# Patient Record
Sex: Male | Born: 1960 | Race: Black or African American | Hispanic: No | Smoking: Current every day smoker
Health system: Southern US, Community
[De-identification: ages and names within clinical notes are randomized; demographics above are authoritative.]

## PROBLEM LIST (undated history)

## (undated) DIAGNOSIS — B1920 Unspecified viral hepatitis C without hepatic coma: Secondary | ICD-10-CM

## (undated) DIAGNOSIS — M79604 Pain in right leg: Secondary | ICD-10-CM

## (undated) DIAGNOSIS — I10 Essential (primary) hypertension: Secondary | ICD-10-CM

## (undated) DIAGNOSIS — J40 Bronchitis, not specified as acute or chronic: Secondary | ICD-10-CM

## (undated) DIAGNOSIS — F250 Schizoaffective disorder, bipolar type: Secondary | ICD-10-CM

## (undated) DIAGNOSIS — F319 Bipolar disorder, unspecified: Secondary | ICD-10-CM

## (undated) DIAGNOSIS — F259 Schizoaffective disorder, unspecified: Secondary | ICD-10-CM

## (undated) DIAGNOSIS — M79605 Pain in left leg: Secondary | ICD-10-CM

## (undated) DIAGNOSIS — K852 Alcohol induced acute pancreatitis without necrosis or infection: Secondary | ICD-10-CM

## (undated) HISTORY — PX: ABDOMINAL SURGERY: SHX537

## (undated) HISTORY — PX: OTHER SURGICAL HISTORY: SHX169

---

## 2014-07-25 ENCOUNTER — Emergency Department (HOSPITAL_COMMUNITY)
Admission: EM | Admit: 2014-07-25 | Discharge: 2014-07-26 | Disposition: A | Discharge status: Home / Self Care | Payer: Medicaid Other | Attending: Emergency Medicine | Admitting: Emergency Medicine

## 2014-07-25 ENCOUNTER — Encounter (HOSPITAL_COMMUNITY): Payer: Self-pay | Admitting: Emergency Medicine

## 2014-07-25 DIAGNOSIS — M79609 Pain in unspecified limb: Secondary | ICD-10-CM | POA: Insufficient documentation

## 2014-07-25 DIAGNOSIS — H9209 Otalgia, unspecified ear: Secondary | ICD-10-CM | POA: Insufficient documentation

## 2014-07-25 DIAGNOSIS — F17200 Nicotine dependence, unspecified, uncomplicated: Secondary | ICD-10-CM | POA: Diagnosis not present

## 2014-07-25 DIAGNOSIS — M79604 Pain in right leg: Secondary | ICD-10-CM

## 2014-07-25 DIAGNOSIS — Z8709 Personal history of other diseases of the respiratory system: Secondary | ICD-10-CM | POA: Insufficient documentation

## 2014-07-25 DIAGNOSIS — M79605 Pain in left leg: Secondary | ICD-10-CM

## 2014-07-25 DIAGNOSIS — H9203 Otalgia, bilateral: Secondary | ICD-10-CM

## 2014-07-25 DIAGNOSIS — Z8719 Personal history of other diseases of the digestive system: Secondary | ICD-10-CM | POA: Insufficient documentation

## 2014-07-25 DIAGNOSIS — I10 Essential (primary) hypertension: Secondary | ICD-10-CM | POA: Diagnosis not present

## 2014-07-25 HISTORY — DX: Bronchitis, not specified as acute or chronic: J40

## 2014-07-25 HISTORY — DX: Pain in left leg: M79.605

## 2014-07-25 HISTORY — DX: Alcohol induced acute pancreatitis without necrosis or infection: K85.20

## 2014-07-25 HISTORY — DX: Pain in right leg: M79.604

## 2014-07-25 HISTORY — DX: Essential (primary) hypertension: I10

## 2014-07-25 NOTE — ED Notes (Addendum)
Presents with chronic pain in bilateral legs, bilateral ear pain, billateral ear redness.

## 2014-07-26 MED ORDER — CETIRIZINE HCL 10 MG PO CAPS: 10.0000 mg | ORAL_CAPSULE | Freq: Every evening | ORAL | Status: DC | PRN

## 2014-07-26 MED ORDER — FLUTICASONE PROPIONATE 50 MCG/ACT NA SUSP: 1.0000 | Freq: Every day | NASAL | Status: DC

## 2014-07-26 MED ORDER — TRAMADOL HCL 50 MG PO TABS
50.0000 mg | ORAL_TABLET | Freq: Once | ORAL | Status: AC
  Administered 2014-07-26: 50 mg via ORAL
  Filled 2014-07-26: qty 1

## 2014-07-26 MED ORDER — TRAMADOL HCL 50 MG PO TABS: 50.0000 mg | ORAL_TABLET | Freq: Four times a day (QID) | ORAL | Status: DC | PRN
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
## 2014-07-26 NOTE — ED Provider Notes (Signed)
Medical screening examination/treatment/procedure(s) were performed by non-physician practitioner and as supervising physician I was immediately available for consultation/collaboration.   Vaeda Westall, MD 07/26/14 0554 

## 2014-07-26 NOTE — Discharge Instructions (Signed)
Otalgia  The most common reason for this in children is an infection of the middle ear. Pain from the middle ear is usually caused by a build-up of fluid and pressure behind the eardrum. Pain from an earache can be sharp, dull, or burning. The pain may be temporary or constant. The middle ear is connected to the nasal passages by a short narrow tube called the Eustachian tube. The Eustachian tube allows fluid to drain out of the middle ear, and helps keep the pressure in your ear equalized.  CAUSES   A cold or allergy can block the Eustachian tube with inflammation and the build-up of secretions. This is especially likely in small children, because their Eustachian tube is shorter and more horizontal. When the Eustachian tube closes, the normal flow of fluid from the middle ear is stopped. Fluid can accumulate and cause stuffiness, pain, hearing loss, and an ear infection if germs start growing in this area.  SYMPTOMS   The symptoms of an ear infection may include fever, ear pain, fussiness, increased crying, and irritability. Many children will have temporary and minor hearing loss during and right after an ear infection. Permanent hearing loss is rare, but the risk increases the more infections a child has. Other causes of ear pain include retained water in the outer ear canal from swimming and bathing.  Ear pain in adults is less likely to be from an ear infection. Ear pain may be referred from other locations. Referred pain may be from the joint between your jaw and the skull. It may also come from a tooth problem or problems in the neck. Other causes of ear pain include:   A foreign body in the ear.   Outer ear infection.   Sinus infections.   Impacted ear wax.   Ear injury.   Arthritis of the jaw or TMJ problems.   Middle ear infection.   Tooth infections.   Sore throat with pain to the ears.  DIAGNOSIS   Your caregiver can usually make the diagnosis by examining you. Sometimes other special studies,  including x-rays and lab work may be necessary.  TREATMENT    If antibiotics were prescribed, use them as directed and finish them even if you or your child's symptoms seem to be improved.   Sometimes PE tubes are needed in children. These are little plastic tubes which are put into the eardrum during a simple surgical procedure. They allow fluid to drain easier and allow the pressure in the middle ear to equalize. This helps relieve the ear pain caused by pressure changes.  HOME CARE INSTRUCTIONS    Only take over-the-counter or prescription medicines for pain, discomfort, or fever as directed by your caregiver. DO NOT GIVE CHILDREN ASPIRIN because of the association of Reye's Syndrome in children taking aspirin.   Use a cold pack applied to the outer ear for 15-20 minutes, 03-04 times per day or as needed may reduce pain. Do not apply ice directly to the skin. You may cause frost bite.   Over-the-counter ear drops used as directed may be effective. Your caregiver may sometimes prescribe ear drops.   Resting in an upright position may help reduce pressure in the middle ear and relieve pain.   Ear pain caused by rapidly descending from high altitudes can be relieved by swallowing or chewing gum. Allowing infants to suck on a bottle during airplane travel can help.   Do not smoke in the house or near children. If you are   unable to quit smoking, smoke outside.   Control allergies.  SEEK IMMEDIATE MEDICAL CARE IF:    You or your child are becoming sicker.   Pain or fever relief is not obtained with medicine.   You or your child's symptoms (pain, fever, or irritability) do not improve within 24 to 48 hours or as instructed.   Severe pain suddenly stops hurting. This may indicate a ruptured eardrum.   You or your children develop new problems such as severe headaches, stiff neck, difficulty swallowing, or swelling of the face or around the ear.  Document Released: 07/19/2004 Document Revised: 02/24/2012  Document Reviewed: 11/23/2008  [employer] Patient Information 2015 [employer]. This information is not intended to replace advice given to you by your health care provider. Make sure you discuss any questions you have with your health care provider.

## 2014-07-31 ENCOUNTER — Encounter (HOSPITAL_BASED_OUTPATIENT_CLINIC_OR_DEPARTMENT_OTHER): Payer: Self-pay | Admitting: Emergency Medicine

## 2014-07-31 ENCOUNTER — Emergency Department (HOSPITAL_BASED_OUTPATIENT_CLINIC_OR_DEPARTMENT_OTHER)
Admission: EM | Admit: 2014-07-31 | Discharge: 2014-07-31 | Disposition: A | Discharge status: Home / Self Care | Payer: Medicaid Other | Attending: Emergency Medicine | Admitting: Emergency Medicine

## 2014-07-31 DIAGNOSIS — IMO0002 Reserved for concepts with insufficient information to code with codable children: Secondary | ICD-10-CM | POA: Diagnosis not present

## 2014-07-31 DIAGNOSIS — Z8709 Personal history of other diseases of the respiratory system: Secondary | ICD-10-CM | POA: Insufficient documentation

## 2014-07-31 DIAGNOSIS — F17200 Nicotine dependence, unspecified, uncomplicated: Secondary | ICD-10-CM | POA: Diagnosis not present

## 2014-07-31 DIAGNOSIS — R109 Unspecified abdominal pain: Secondary | ICD-10-CM | POA: Insufficient documentation

## 2014-07-31 DIAGNOSIS — K861 Other chronic pancreatitis: Secondary | ICD-10-CM | POA: Diagnosis not present

## 2014-07-31 DIAGNOSIS — Z79899 Other long term (current) drug therapy: Secondary | ICD-10-CM | POA: Diagnosis not present

## 2014-07-31 DIAGNOSIS — I10 Essential (primary) hypertension: Secondary | ICD-10-CM | POA: Insufficient documentation

## 2014-07-31 DIAGNOSIS — F1010 Alcohol abuse, uncomplicated: Secondary | ICD-10-CM | POA: Insufficient documentation

## 2014-07-31 DIAGNOSIS — H9209 Otalgia, unspecified ear: Secondary | ICD-10-CM | POA: Diagnosis not present

## 2014-07-31 DIAGNOSIS — H9190 Unspecified hearing loss, unspecified ear: Secondary | ICD-10-CM | POA: Insufficient documentation

## 2014-07-31 DIAGNOSIS — K860 Alcohol-induced chronic pancreatitis: Secondary | ICD-10-CM

## 2014-07-31 LAB — CBC WITH DIFFERENTIAL/PLATELET
Basophils Absolute: 0 10*3/uL (ref 0.0–0.1)
Basophils Relative: 0 % (ref 0–1)
Eosinophils Absolute: 0.2 10*3/uL (ref 0.0–0.7)
Eosinophils Relative: 3 % (ref 0–5)
HEMATOCRIT: 36.8 % — AB (ref 39.0–52.0)
HEMOGLOBIN: 13.2 g/dL (ref 13.0–17.0)
LYMPHS PCT: 51 % — AB (ref 12–46)
Lymphs Abs: 3.8 10*3/uL (ref 0.7–4.0)
MCH: 35.4 pg — ABNORMAL HIGH (ref 26.0–34.0)
MCHC: 35.9 g/dL (ref 30.0–36.0)
MCV: 98.7 fL (ref 78.0–100.0)
MONO ABS: 0.9 10*3/uL (ref 0.1–1.0)
Monocytes Relative: 12 % (ref 3–12)
NEUTROS ABS: 2.5 10*3/uL (ref 1.7–7.7)
Neutrophils Relative %: 33 % — ABNORMAL LOW (ref 43–77)
Platelets: 183 10*3/uL (ref 150–400)
RBC: 3.73 MIL/uL — ABNORMAL LOW (ref 4.22–5.81)
RDW: 15.8 % — AB (ref 11.5–15.5)
WBC: 7.4 10*3/uL (ref 4.0–10.5)

## 2014-07-31 LAB — COMPREHENSIVE METABOLIC PANEL
ALBUMIN: 2.8 g/dL — AB (ref 3.5–5.2)
ALT: 70 U/L — ABNORMAL HIGH (ref 0–53)
AST: 127 U/L — ABNORMAL HIGH (ref 0–37)
Alkaline Phosphatase: 172 U/L — ABNORMAL HIGH (ref 39–117)
Anion gap: 12 (ref 5–15)
BILIRUBIN TOTAL: 1 mg/dL (ref 0.3–1.2)
BUN: 13 mg/dL (ref 6–23)
CO2: 22 mEq/L (ref 19–32)
CREATININE: 1 mg/dL (ref 0.50–1.35)
Calcium: 9 mg/dL (ref 8.4–10.5)
Chloride: 104 mEq/L (ref 96–112)
GFR calc Af Amer: >90 mL/min (ref >90)
GFR calc non Af Amer: 84 mL/min — ABNORMAL LOW (ref >90)
Glucose, Bld: 87 mg/dL (ref 70–99)
Potassium: 4.1 mEq/L (ref 3.7–5.3)
Sodium: 138 mEq/L (ref 137–147)
TOTAL PROTEIN: 7.5 g/dL (ref 6.0–8.3)

## 2014-07-31 LAB — LIPASE, BLOOD: LIPASE: 64 U/L — AB (ref 11–59)

## 2014-07-31 MED ORDER — OXYCODONE HCL 5 MG PO TABS
5.0000 mg | ORAL_TABLET | Freq: Once | ORAL | Status: AC
  Administered 2014-07-31: 5 mg via ORAL
  Filled 2014-07-31: qty 1

## 2014-07-31 MED ORDER — OXYCODONE HCL 5 MG PO TABS: 5.0000 mg | ORAL_TABLET | Freq: Four times a day (QID) | ORAL | Status: DC | PRN
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
## 2014-07-31 NOTE — Discharge Instructions (Signed)
Low-Fat Diet for Pancreatitis or Gallbladder Conditions Avoid alcohol. Stay on clear liquids for the next 24 hours. To help with  Your drinking problem. Call in the of the numbers on the resource guide to get help. Contact Dr. Jearld Fenton scheduled office appointment regarding your ear pain and hearing loss A low-fat diet can be helpful if you have pancreatitis or a gallbladder condition. With these conditions, your pancreas and gallbladder have trouble digesting fats. A healthy eating plan with less fat will help rest your pancreas and gallbladder and reduce your symptoms. WHAT DO I NEED TO KNOW ABOUT THIS DIET?  Eat a low-fat diet.  Reduce your fat intake to less than 20-30% of your total daily calories. This is less than 50-60 g of fat per day.  Remember that you need some fat in your diet. Ask your dietician what your daily goal should be.  Choose nonfat and low-fat healthy foods. Look for the words "nonfat," "low fat," or "fat free."  As a guide, look on the label and choose foods with less than 3 g of fat per serving. Eat only one serving.  Avoid alcohol.  Do not smoke. If you need help quitting, talk with your health care provider.  Eat small frequent meals instead of three large heavy meals. WHAT FOODS CAN I EAT? Grains Include healthy grains and starches such as potatoes, wheat bread, fiber-rich cereal, and brown rice. Choose whole grain options whenever possible. In adults, whole grains should account for 45-65% of your daily calories.  Fruits and Vegetables Eat plenty of fruits and vegetables. Fresh fruits and vegetables add fiber to your diet. Meats and Other Protein Sources Eat lean meat such as chicken and pork. Trim any fat off of meat before cooking it. Eggs, fish, and beans are other sources of protein. In adults, these foods should account for 10-35% of your daily calories. Dairy Choose low-fat milk and dairy options. Dairy includes fat and protein, as well as calcium.  Fats  and Oils Limit high-fat foods such as fried foods, sweets, baked goods, sugary drinks.  Other Creamy sauces and condiments, such as mayonnaise, can add extra fat. Think about whether or not you need to use them, or use smaller amounts or low fat options. WHAT FOODS ARE NOT RECOMMENDED?  High fat foods, such as:  [employer].  Ice cream.  [place] toast.  Sweet rolls.  Pizza.  Cheese bread.  Foods covered with batter, butter, creamy sauces, or cheese.  Fried foods.  Sugary drinks and desserts.  Foods that cause gas or bloating Document Released: 12/07/2013 Document Reviewed: 12/07/2013 [facility] Patient Information 2015 [place], [place]. This information is not intended to replace advice given to you by your health care provider. Make sure you discuss any questions you have with your health care provider.  Emergency Department Resource Guide 1) Find a Doctor and Pay Out of Pocket Although you won't have to find out who is covered by your insurance plan, it is a good idea to ask around and get recommendations. You will then need to call the office and see if the doctor you have chosen will accept you as a new patient and what types of options they offer for patients who are self-pay. Some doctors offer discounts or will set up payment plans for their patients who do not have insurance, but you will need to ask so you aren't surprised when you get to your appointment.  2) Contact Your Local Health Department Not all health departments have  doctors that can see patients for sick visits, but many do, so it is worth a call to see if yours does. If you don't know where your local health department is, you can check in your phone book. The CDC also has a tool to help you locate your state's health department, and many state websites also have listings of all of their local health departments.  3) Find a Walk-in Clinic If your illness is not likely to be very severe or complicated, you may  want to try a walk in clinic. These are popping up all over the country in pharmacies, drugstores, and shopping centers. They're usually staffed by nurse practitioners or physician assistants that have been trained to treat common illnesses and complaints. They're usually fairly quick and inexpensive. However, if you have serious medical issues or chronic medical problems, these are probably not your best option.  No Primary Care Doctor: - Call Health Connect at  [phone] - they can help you locate a primary care doctor that  accepts your insurance, provides certain services, etc. - Physician Referral Service- [phone]  Chronic Pain Problems: Organization         Address  Phone   Notes  [facility]  [phone] Patients need to be referred by their primary care doctor.   Medication Assistance: Organization         Address  Phone   Notes  [facility] Medication [facility] [address] [place], [place] [address] [phone] --Must be a resident of [facility] -- Must have NO insurance coverage whatsoever (no Medicaid/ Medicare, etc.) -- The pt. MUST have a primary care doctor that directs their care regularly and follows them in the community   [facility]ist  [phone]   [employer]  [phone]    Agencies that provide inexpensive medical care: Organization         Address  Phone   Notes  [facility]  [phone]   [facility]    [phone]   [facility] [address] [place], [place] [address] [phone]   [facility] [address] [place] [place] [phone]   Planned Parenthood    [phone]   [facility]    [phone]   [facility]  [address], [place] Phone:  [phone], Fax:  [phone] Hours of Operation:  9 am - 6 pm, M-F.  Also accepts Medicaid/Medicare and self-pay.   [facility]  [address], [place] Phone: [phone], Fax: [phone]. Hours of Operation:  8:30 am - 5:30 pm, M-F.  Also accepts Medicaid and self-pay.  [facility] [place] [address] [place] Phone: [phone]   [facility] [address] Natasha Bence [place], [place] [phone], Ext. [phone] Mondays & Thursdays: 7-9 AM.  First 15 patients are seen on a first come, first serve basis.    Medicaid-accepting [facility] Providers:  Organization         Address  Phone   Notes  [facility] [address], [place] [phone] Also accepts self-pay patients.  [facility] [address] Laurell Josephs [place], [place]  [phone]   [facility] [address], [place] [phone]   [facility] [address] [place], [place] [phone]   Renaye Rakers [address], [place]   [phone] Only accepts [place] patients after they have their name applied to their card.   Self-Pay (no insurance) in [facility]:  Organization         Address  Phone   Notes  Sickle Cell Patients, [facility] [address] [place], [place] [phone]   [facility] [address] [place], [place] [phone]   [facility] [place]  [address], [place] [phone]   [facility]/Dr. Osei-Bonsu  [address] [place] or [address], [place] [phone] Phone number for both [place] and [place] locations is the same.  [facility] [address] [place] [phone]   [facility] [address] [place] or [address] [phone]   [facility] [address], [place] [phone], phone; [phone], fax Sees patients 1st and 3rd Saturday of every month.  Must not qualify for public or private insurance (i.e. Medicaid, Medicare, [place] Health Choice, Veterans' Benefits)  Household income should be no more than 200% of the poverty level The clinic cannot treat you if you are pregnant or think you are pregnant  Sexually transmitted diseases are not treated at the clinic.    Dental Care: Organization         Address  Phone  Notes  [facility] Department of [facility] [address] [place], [place] [phone] Accepts children up to age 20 who are enrolled in [place] or [place] Health Choice; pregnant women with a Medicaid card; and children who have applied for Medicaid or [place] Health Choice, but were declined, whose parents can pay a reduced fee at time of service.  [facility] Department of [facility]  [address], Rosa Sanchez [phone] Accepts children up to age 57 who are enrolled in [place] or [place] Health Choice; pregnant women with a Medicaid card; and children who have applied for Medicaid or [place] Health Choice, but were declined, whose parents can pay a reduced fee at time of service.  [facility]  [address] [place], [place] [phone] Patients are seen by appointment only. Walk-ins are not accepted. [facility] will see patients 46 years of age and older. Monday - Tuesday (8am-5pm) Most Wednesdays (8:30-5pm) $30 per visit, cash only  [facility]  [address], [facility] [place] [facility] [phone] Patients are seen by appointment only. Walk-ins are not accepted. [facility] will see patients 35 years of age and older. One Wednesday Evening (Monthly: Volunteer Based).  $30 per visit, cash only  [employer]  [phone] for adults; Children under age 33, call Graduate Pediatric Dentistry at [phone]. Children aged 49-14, please call [phone] to request a pediatric application.  Dental services are provided in all areas of dental care including fillings, crowns and bridges, complete and partial dentures, implants, gum treatment, root canals, and extractions. Preventive care is also provided. Treatment is provided to both adults and children. Patients are selected via a lottery and there is often a waiting list.   [facility] [address], [place]  [phone] [email or web address]   [facility] [phone]  [address], [place], [place] [phone], Ext. [phone] Second and Fourth Thursday of each month, opens at 6:30 AM; Clinic ends at 9 AM.  Patients are seen on a first-come first-served basis, and a limited number are seen during each clinic.   [facility]  [address], [place], [place] [phone]   Eligibility Requirements You must have lived in [place], [place], or [place] counties for at least the last three months.   You cannot be eligible for state or federal sponsored [place] Cityhealthcare insurance, including CIGNAVeterans Administration, [place]icaid, or [employer]icare.   You generally cannot be eligible for healthcare insurance through your employer.    How to apply: Eligibility screenings are held every Tuesday and Wednesday afternoon from 1:00 pm until 4:00 pm. You do not need an appointment for the interview!  [facility] [address], [place], [place] [phone]   [facility]  [phone]   [facility]  [phone]   [facility]  [phone]    Behavioral Health Resources in the Community: Intensive Outpatient Programs Organization         Address  Phone  Notes  [facility] [address], [place], [place] [phone]   [facility] [address], [place], [place] [phone]   [facility] [address], [place], [place]  [phone]    [facility] [address],  [place], [place] [phone] or [phone]   Substance Abuse Resources Organization         Address  Phone  Notes  [facility]  [phone]   [facility]  [phone]   [facility]  [phone]   [facility]  [phone]   Residential & Outpatient Substance Abuse Program  [phone]   Psychological Services Organization         Address  Phone  Notes  [facility]  [phone]- [phone]   [facility]  [phone]- [phone]   [facility] [address], [place] [phone] or [phone]    Mobile Crisis Teams Organization         Address  Phone  Notes  [facility], Mobile Crisis Care Unit  [phone]   [facility]  [address] [place], [place] [phone]   Doristine LocksSharon DeEsch [address] [place] [phone]    Self-Help/Support Groups Organization         Address  Phone             Notes  [facility] [place] - variety of support groups  336- [phone] Call for more information  Narcotics Anonymous (NA), [facility] [address], [place]  2 meetings at this location   [facility]ian[facility]idential Treatment Programs Organization         Address  Phone  Notes  [facility] [address],    [place]  [phone]   [facility]  [address], [place] [phone], [place], [place] [phone]   [facility] [address], [place] [phone] Admissions: 8am-3pm M-F  [facility] [address]    [place], [place] [phone]   [facility] [address], [place], [place] [phone]   [facility] [address]  [place], [place] [phone]   [facility] - Intensive Outpatient [phone], [place], [place]  [phone]   [facility] ([facility] [address] [place].,  [place], [place] [phone] or [phone]   [facility] ([facility]) [address], [place], [place] [phone] Accepts Medicaid  [facility] [address]  [place] [phone] Substance Abuse/Addiction Treatment   [facility] Organization         Address  Phone  Notes  [facility]  [phone]   Angie Fava, PhD [address] [place], [place]   [phone] or [phone]   [facility]   [address] [place], [place] [phone]   [facility] [address] [place], [place] [phone] Insurance/Medicaid/sponsorship through [facility] and Families [address]                                    [place], [place] [phone] Therapy/tele-psych/case  [facility] [address], [place] [phone]    Dr. Lolly Mustache  [phone]   [facility] [place]  [employer] [facility] Dept. 1) [address] [place] 2) [address] [place] 3)  [address], [place] [phone]  [phone]   [facility] Child Abuse Hotline ([phone] or 5158101826 (After Hours)

## 2014-07-31 NOTE — ED Notes (Signed)
Patient states that his abd hurts, has had pancreatitis in th epast and feels the same. Also c/o hearing loss in both ears.

## 2016-11-15 DIAGNOSIS — R945 Abnormal results of liver function studies: Secondary | ICD-10-CM | POA: Insufficient documentation

## 2016-11-15 DIAGNOSIS — F419 Anxiety disorder, unspecified: Secondary | ICD-10-CM | POA: Insufficient documentation

## 2016-11-15 DIAGNOSIS — K529 Noninfective gastroenteritis and colitis, unspecified: Secondary | ICD-10-CM | POA: Insufficient documentation

## 2016-11-15 DIAGNOSIS — R7989 Other specified abnormal findings of blood chemistry: Secondary | ICD-10-CM

## 2016-11-15 DIAGNOSIS — I10 Essential (primary) hypertension: Secondary | ICD-10-CM | POA: Insufficient documentation

## 2016-11-15 DIAGNOSIS — R778 Other specified abnormalities of plasma proteins: Secondary | ICD-10-CM | POA: Insufficient documentation

## 2016-11-15 DIAGNOSIS — F329 Major depressive disorder, single episode, unspecified: Secondary | ICD-10-CM | POA: Insufficient documentation

## 2016-12-02 ENCOUNTER — Encounter (HOSPITAL_COMMUNITY): Payer: Self-pay | Admitting: Emergency Medicine

## 2016-12-02 ENCOUNTER — Inpatient Hospital Stay (HOSPITAL_COMMUNITY): Payer: Medicaid Other

## 2016-12-02 ENCOUNTER — Inpatient Hospital Stay (HOSPITAL_COMMUNITY)
Admission: EM | Admit: 2016-12-02 | Discharge: 2016-12-10 | DRG: 441 | Disposition: A | Discharge status: Skilled Nursing Facility | Payer: Medicaid Other | Attending: Internal Medicine | Admitting: Internal Medicine

## 2016-12-02 ENCOUNTER — Emergency Department (HOSPITAL_COMMUNITY): Payer: Medicaid Other

## 2016-12-02 DIAGNOSIS — I890 Lymphedema, not elsewhere classified: Secondary | ICD-10-CM | POA: Diagnosis present

## 2016-12-02 DIAGNOSIS — I110 Hypertensive heart disease with heart failure: Secondary | ICD-10-CM | POA: Diagnosis present

## 2016-12-02 DIAGNOSIS — R600 Localized edema: Secondary | ICD-10-CM | POA: Diagnosis not present

## 2016-12-02 DIAGNOSIS — R601 Generalized edema: Secondary | ICD-10-CM | POA: Diagnosis not present

## 2016-12-02 DIAGNOSIS — F1010 Alcohol abuse, uncomplicated: Secondary | ICD-10-CM | POA: Diagnosis present

## 2016-12-02 DIAGNOSIS — K7682 Hepatic encephalopathy: Secondary | ICD-10-CM | POA: Diagnosis present

## 2016-12-02 DIAGNOSIS — I4581 Long QT syndrome: Secondary | ICD-10-CM | POA: Diagnosis present

## 2016-12-02 DIAGNOSIS — R262 Difficulty in walking, not elsewhere classified: Secondary | ICD-10-CM

## 2016-12-02 DIAGNOSIS — E876 Hypokalemia: Secondary | ICD-10-CM | POA: Diagnosis present

## 2016-12-02 DIAGNOSIS — R609 Edema, unspecified: Secondary | ICD-10-CM

## 2016-12-02 DIAGNOSIS — K7200 Acute and subacute hepatic failure without coma: Secondary | ICD-10-CM | POA: Diagnosis present

## 2016-12-02 DIAGNOSIS — N5082 Scrotal pain: Secondary | ICD-10-CM | POA: Diagnosis present

## 2016-12-02 DIAGNOSIS — I5033 Acute on chronic diastolic (congestive) heart failure: Secondary | ICD-10-CM | POA: Diagnosis present

## 2016-12-02 DIAGNOSIS — Z79899 Other long term (current) drug therapy: Secondary | ICD-10-CM

## 2016-12-02 DIAGNOSIS — K7290 Hepatic failure, unspecified without coma: Secondary | ICD-10-CM | POA: Diagnosis not present

## 2016-12-02 DIAGNOSIS — D689 Coagulation defect, unspecified: Secondary | ICD-10-CM | POA: Diagnosis present

## 2016-12-02 DIAGNOSIS — E8809 Other disorders of plasma-protein metabolism, not elsewhere classified: Secondary | ICD-10-CM | POA: Diagnosis present

## 2016-12-02 DIAGNOSIS — N5089 Other specified disorders of the male genital organs: Secondary | ICD-10-CM | POA: Diagnosis present

## 2016-12-02 DIAGNOSIS — D649 Anemia, unspecified: Secondary | ICD-10-CM | POA: Diagnosis present

## 2016-12-02 DIAGNOSIS — F17210 Nicotine dependence, cigarettes, uncomplicated: Secondary | ICD-10-CM | POA: Diagnosis present

## 2016-12-02 DIAGNOSIS — F259 Schizoaffective disorder, unspecified: Secondary | ICD-10-CM | POA: Diagnosis present

## 2016-12-02 DIAGNOSIS — B1920 Unspecified viral hepatitis C without hepatic coma: Secondary | ICD-10-CM | POA: Diagnosis present

## 2016-12-02 DIAGNOSIS — R52 Pain, unspecified: Secondary | ICD-10-CM

## 2016-12-02 DIAGNOSIS — N50819 Testicular pain, unspecified: Secondary | ICD-10-CM

## 2016-12-02 HISTORY — DX: Unspecified viral hepatitis C without hepatic coma: B19.20

## 2016-12-02 LAB — CBC WITH DIFFERENTIAL/PLATELET
BASOS ABS: 0 10*3/uL (ref 0.0–0.1)
Basophils Relative: 0 %
Eosinophils Absolute: 0.2 10*3/uL (ref 0.0–0.7)
Eosinophils Relative: 1 %
HCT: 29.9 % — ABNORMAL LOW (ref 39.0–52.0)
Hemoglobin: 10.6 g/dL — ABNORMAL LOW (ref 13.0–17.0)
LYMPHS PCT: 23 %
Lymphs Abs: 3.5 10*3/uL (ref 0.7–4.0)
MCH: 34.3 pg — AB (ref 26.0–34.0)
MCHC: 35.5 g/dL (ref 30.0–36.0)
MCV: 96.8 fL (ref 78.0–100.0)
MONOS PCT: 13 %
Monocytes Absolute: 2 10*3/uL — ABNORMAL HIGH (ref 0.1–1.0)
NEUTROS PCT: 63 %
Neutro Abs: 9.6 10*3/uL — ABNORMAL HIGH (ref 1.7–7.7)
PLATELETS: 173 10*3/uL (ref 150–400)
RBC: 3.09 MIL/uL — ABNORMAL LOW (ref 4.22–5.81)
RDW: 17.3 % — ABNORMAL HIGH (ref 11.5–15.5)
WBC: 15.3 10*3/uL — AB (ref 4.0–10.5)

## 2016-12-02 LAB — COMPREHENSIVE METABOLIC PANEL
ALT: 37 U/L (ref 17–63)
AST: 90 U/L — ABNORMAL HIGH (ref 15–41)
Albumin: 2.1 g/dL — ABNORMAL LOW (ref 3.5–5.0)
Alkaline Phosphatase: 154 U/L — ABNORMAL HIGH (ref 38–126)
Anion gap: 7 (ref 5–15)
BUN: 14 mg/dL (ref 6–20)
CHLORIDE: 108 mmol/L (ref 101–111)
CO2: 24 mmol/L (ref 22–32)
Calcium: 8.7 mg/dL — ABNORMAL LOW (ref 8.9–10.3)
Creatinine, Ser: 1.18 mg/dL (ref 0.61–1.24)
Glucose, Bld: 96 mg/dL (ref 65–99)
Potassium: 3.6 mmol/L (ref 3.5–5.1)
SODIUM: 139 mmol/L (ref 135–145)
Total Bilirubin: 6.5 mg/dL — ABNORMAL HIGH (ref 0.3–1.2)
Total Protein: 7.8 g/dL (ref 6.5–8.1)

## 2016-12-02 LAB — AMMONIA: Ammonia: 83 umol/L — ABNORMAL HIGH (ref 9–35)

## 2016-12-02 LAB — LIPASE, BLOOD: Lipase: 25 U/L (ref 11–51)

## 2016-12-02 LAB — TSH: TSH: 1.882 u[IU]/mL (ref 0.350–4.500)

## 2016-12-02 LAB — PROTIME-INR
INR: 2.04
PROTHROMBIN TIME: 23.3 s — AB (ref 11.4–15.2)

## 2016-12-02 LAB — BRAIN NATRIURETIC PEPTIDE: B NATRIURETIC PEPTIDE 5: 291.2 pg/mL — AB (ref 0.0–100.0)

## 2016-12-02 LAB — I-STAT TROPONIN, ED: TROPONIN I, POC: 0 ng/mL (ref 0.00–0.08)

## 2016-12-02 LAB — ETHANOL

## 2016-12-02 LAB — MAGNESIUM: MAGNESIUM: 1.7 mg/dL (ref 1.7–2.4)

## 2016-12-02 MED ORDER — SODIUM CHLORIDE 0.9% FLUSH
3.0000 mL | Freq: Two times a day (BID) | INTRAVENOUS | Status: DC
  Administered 2016-12-02 – 2016-12-04 (×4): 3 mL via INTRAVENOUS

## 2016-12-02 MED ORDER — FUROSEMIDE 10 MG/ML IJ SOLN: 60.0000 mg | Freq: Once | INTRAMUSCULAR | Status: DC

## 2016-12-02 MED ORDER — FOLIC ACID 1 MG PO TABS
1.0000 mg | ORAL_TABLET | Freq: Every day | ORAL | Status: DC
  Administered 2016-12-03 – 2016-12-10 (×8): 1 mg via ORAL
  Filled 2016-12-02 (×8): qty 1

## 2016-12-02 MED ORDER — ADULT MULTIVITAMIN W/MINERALS CH
1.0000 | ORAL_TABLET | Freq: Every day | ORAL | Status: DC
  Administered 2016-12-03 – 2016-12-10 (×8): 1 via ORAL
  Filled 2016-12-02 (×8): qty 1

## 2016-12-02 MED ORDER — LORAZEPAM 1 MG PO TABS: 0.0000 mg | ORAL_TABLET | Freq: Two times a day (BID) | ORAL | Status: DC

## 2016-12-02 MED ORDER — LACTULOSE 10 GM/15ML PO SOLN
20.0000 g | Freq: Three times a day (TID) | ORAL | Status: DC
  Administered 2016-12-02 – 2016-12-03 (×2): 20 g via ORAL
  Filled 2016-12-02 (×2): qty 30

## 2016-12-02 MED ORDER — SODIUM CHLORIDE 0.9% FLUSH: 3.0000 mL | INTRAVENOUS | Status: DC | PRN

## 2016-12-02 MED ORDER — SERTRALINE HCL 50 MG PO TABS
100.0000 mg | ORAL_TABLET | Freq: Every day | ORAL | Status: DC
  Administered 2016-12-03 – 2016-12-10 (×8): 100 mg via ORAL
  Filled 2016-12-02 (×8): qty 2

## 2016-12-02 MED ORDER — VITAMIN B-1 100 MG PO TABS
100.0000 mg | ORAL_TABLET | Freq: Every day | ORAL | Status: DC
  Administered 2016-12-03 – 2016-12-10 (×8): 100 mg via ORAL
  Filled 2016-12-02 (×8): qty 1

## 2016-12-02 MED ORDER — LORAZEPAM 1 MG PO TABS
0.0000 mg | ORAL_TABLET | Freq: Four times a day (QID) | ORAL | Status: DC
  Administered 2016-12-03 (×2): 1 mg via ORAL
  Administered 2016-12-04: 2 mg via ORAL
  Filled 2016-12-02: qty 1
  Filled 2016-12-02: qty 2
  Filled 2016-12-02: qty 4
  Filled 2016-12-02: qty 2

## 2016-12-02 MED ORDER — SODIUM CHLORIDE 0.9 % IV SOLN: 250.0000 mL | INTRAVENOUS | Status: DC | PRN

## 2016-12-02 MED ORDER — LORAZEPAM 1 MG PO TABS: 1.0000 mg | ORAL_TABLET | Freq: Four times a day (QID) | ORAL | Status: DC | PRN

## 2016-12-02 MED ORDER — FUROSEMIDE 10 MG/ML IJ SOLN
60.0000 mg | Freq: Two times a day (BID) | INTRAMUSCULAR | Status: DC
  Administered 2016-12-03 – 2016-12-10 (×15): 60 mg via INTRAVENOUS
  Filled 2016-12-02 (×15): qty 6

## 2016-12-02 MED ORDER — ACETAMINOPHEN 325 MG PO TABS: 650.0000 mg | ORAL_TABLET | Freq: Four times a day (QID) | ORAL | Status: DC | PRN

## 2016-12-02 MED ORDER — SODIUM CHLORIDE 0.9% FLUSH
3.0000 mL | Freq: Two times a day (BID) | INTRAVENOUS | Status: DC
  Administered 2016-12-03 – 2016-12-10 (×15): 3 mL via INTRAVENOUS

## 2016-12-02 MED ORDER — ACETAMINOPHEN 650 MG RE SUPP: 650.0000 mg | Freq: Four times a day (QID) | RECTAL | Status: DC | PRN

## 2016-12-02 MED ORDER — THIAMINE HCL 100 MG/ML IJ SOLN: 100.0000 mg | Freq: Every day | INTRAMUSCULAR | Status: DC

## 2016-12-02 MED ORDER — PANTOPRAZOLE SODIUM 40 MG PO TBEC
40.0000 mg | DELAYED_RELEASE_TABLET | Freq: Two times a day (BID) | ORAL | Status: DC
Start: 2016-12-02 — End: 2016-12-10
  Administered 2016-12-02 – 2016-12-10 (×16): 40 mg via ORAL
  Filled 2016-12-02 (×16): qty 1

## 2016-12-02 MED ORDER — LORAZEPAM 2 MG/ML IJ SOLN: 1.0000 mg | Freq: Four times a day (QID) | INTRAMUSCULAR | Status: DC | PRN

## 2016-12-02 NOTE — ED Triage Notes (Signed)
[facility] of [facility] pain/swelling x "a few days". Pt is alert, unable to give clear report. Arrives incontinent, swollen groin region. Hx of substance abuse, Hep C, CHF. Per EMS, weight was 296lbs on 12/6 and was 380lbs today. No signs of resp distress
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
## 2016-12-02 NOTE — Progress Notes (Signed)
[facility] and patient admitted to [address]. Patient oriented to room and call bell. Cardiac monitoring initiated and vital signs obtained. Will pass on to night shift Rn. Lenord CarboAubrey  Luisdaniel Kenton

## 2016-12-03 LAB — AMMONIA: AMMONIA: 106 umol/L — AB (ref 9–35)

## 2016-12-03 LAB — COMPREHENSIVE METABOLIC PANEL
ALBUMIN: 1.9 g/dL — AB (ref 3.5–5.0)
ALT: 34 U/L (ref 17–63)
AST: 82 U/L — AB (ref 15–41)
Alkaline Phosphatase: 133 U/L — ABNORMAL HIGH (ref 38–126)
Anion gap: 6 (ref 5–15)
BUN: 13 mg/dL (ref 6–20)
CHLORIDE: 109 mmol/L (ref 101–111)
CO2: 24 mmol/L (ref 22–32)
Calcium: 8.4 mg/dL — ABNORMAL LOW (ref 8.9–10.3)
Creatinine, Ser: 1 mg/dL (ref 0.61–1.24)
GFR calc Af Amer: >60 mL/min (ref >60)
GFR calc non Af Amer: >60 mL/min (ref >60)
GLUCOSE: 89 mg/dL (ref 65–99)
POTASSIUM: 3.3 mmol/L — AB (ref 3.5–5.1)
SODIUM: 139 mmol/L (ref 135–145)
Total Bilirubin: 5.7 mg/dL — ABNORMAL HIGH (ref 0.3–1.2)
Total Protein: 7.3 g/dL (ref 6.5–8.1)

## 2016-12-03 LAB — CBC
HEMATOCRIT: 28.4 % — AB (ref 39.0–52.0)
Hemoglobin: 10.1 g/dL — ABNORMAL LOW (ref 13.0–17.0)
MCH: 34.5 pg — ABNORMAL HIGH (ref 26.0–34.0)
MCHC: 35.6 g/dL (ref 30.0–36.0)
MCV: 96.9 fL (ref 78.0–100.0)
Platelets: 161 10*3/uL (ref 150–400)
RBC: 2.93 MIL/uL — ABNORMAL LOW (ref 4.22–5.81)
RDW: 17.4 % — AB (ref 11.5–15.5)
WBC: 9.7 10*3/uL (ref 4.0–10.5)

## 2016-12-03 LAB — MAGNESIUM: MAGNESIUM: 1.7 mg/dL (ref 1.7–2.4)

## 2016-12-03 MED ORDER — LACTULOSE 10 GM/15ML PO SOLN
30.0000 g | Freq: Three times a day (TID) | ORAL | Status: DC
  Administered 2016-12-03 – 2016-12-04 (×3): 30 g via ORAL
  Filled 2016-12-03 (×2): qty 45

## 2016-12-03 MED ORDER — RIFAXIMIN 550 MG PO TABS
550.0000 mg | ORAL_TABLET | Freq: Three times a day (TID) | ORAL | Status: DC
  Administered 2016-12-03 – 2016-12-10 (×22): 550 mg via ORAL
  Filled 2016-12-03 (×22): qty 1

## 2016-12-03 MED ORDER — MAGNESIUM SULFATE IN D5W 1-5 GM/100ML-% IV SOLN
1.0000 g | Freq: Once | INTRAVENOUS | Status: AC
  Administered 2016-12-03: 1 g via INTRAVENOUS
  Filled 2016-12-03: qty 100

## 2016-12-03 NOTE — Progress Notes (Signed)
Patient had second run of V. Tach while resting in bed. Pt is asymptomatic. On call triad paged. Donnamarie PoagK. Kirby. Hoy MornElan Morelia Cassells, RN

## 2016-12-03 NOTE — Progress Notes (Signed)
Spoke to Best BuyHeather RN regarding the need for a PICC line. [facility], the nurse will follow up with the MD regarding to continued need for central access and update the Vascular Speciality IV Team accordingly. Lawrence Barajas, Ryo Klang M

## 2016-12-03 NOTE — Evaluation (Signed)
Physical Therapy Evaluation Patient Details Name: Lawrence Barajas MRN: [id number] DOB: 12/14/1961 Today's Date: 12/03/2016   History of Present Illness  Lawrence Barajas is a 55 y.o. male with a past medical history of hepatitis C, schizoaffective disorder, chronic lymphedema of the lower extremities who was hospitalized at [facility] earlier this month for hematemesis. Apparently he's had some scrotal pain and scrotal swelling ongoing for a few days. Apparently he has also gained about 50-60 pounds in the last couple of weeks. patient was found to be jaundiced, he was found to have anasarca. He was found to have elevated ammonia level. He is thought to have hepatic encephalopathy  Clinical Impression  Pt admitted with above diagnosis. Pt currently with functional limitations due to the deficits listed below (see PT Problem List).  Pt will benefit from skilled PT to increase their independence and safety with mobility to allow discharge to the venue listed below.  Pt limited primarily by scrotal swelling and subsequent discomfort.  Will follow acutely, but do not feel he will need any PT follow up.     Follow Up Recommendations No PT follow up    Equipment Recommendations  None recommended by PT    Recommendations for Other Services       Precautions / Restrictions Precautions Precautions: Fall      Mobility  Bed Mobility Overal bed mobility: Needs Assistance Bed Mobility: Supine to Sit     Supine to sit: Supervision     General bed mobility comments: Pt able to get self to EOB slowly with rail  Transfers Overall transfer level: Needs assistance Equipment used: None Transfers: Sit to/from Stand Sit to Stand: Supervision         General transfer comment: S for safety. Stands with wide BOS due to scrotal swelling  Ambulation/Gait Ambulation/Gait assistance: Min guard Ambulation Distance (Feet): 10 Feet Assistive device:  (holding onto bed) Gait  Pattern/deviations: Wide base of support;Trunk flexed;Decreased step length - right;Decreased step length - left Gait velocity: decreased   General Gait Details: Pt initially declined gait due to discomfort from scrotal swelling.  Pt then agreeable to ambulate around the bed to recliner.  He held onto bed and had a very wide BOS.  Stairs            Wheelchair Mobility    Modified Rankin (Stroke Patients Only)       Balance Overall balance assessment: Needs assistance   Sitting balance-Leahy Scale: Good       Standing balance-Leahy Scale: Fair                               Pertinent Vitals/Pain Pain Assessment: 0-10 Pain Score: 8  Pain Location: scrotum and abdomen Pain Descriptors / Indicators: Grimacing Pain Intervention(s): Repositioned;Limited activity within patient's tolerance    Home Living Family/patient expects to be discharged to:: Group home (drug rehab home)                 Additional Comments: 1 level with no steps to enter    Prior Function Level of Independence: Independent               Hand Dominance        Extremity/Trunk Assessment   Upper Extremity Assessment Upper Extremity Assessment: Defer to OT evaluation    Lower Extremity Assessment Lower Extremity Assessment: RLE deficits/detail;LLE deficits/detail RLE Deficits / Details: swollen with severe scrotal edema LLE Deficits /  Details: swollen with severe scrotal edema       Communication   Communication: No difficulties  Cognition Arousal/Alertness: Awake/alert   Overall Cognitive Status: No family/caregiver present to determine baseline cognitive functioning Area of Impairment: Memory;Following commands;Safety/judgement;Awareness;Problem solving     Memory: Decreased short-term memory Following Commands: Follows one step commands consistently;Follows one step commands with increased time Safety/Judgement: Decreased awareness of safety Awareness:  Emergent   General Comments: Needing to repeat things several times    General Comments General comments (skin integrity, edema, etc.): edema throughout lower half of body    Exercises     Assessment/Plan    PT Assessment Patient needs continued PT services  PT Problem List Decreased mobility;Pain          PT Treatment Interventions DME instruction;Gait training;Functional mobility training;Therapeutic activities;Therapeutic exercise;Patient/family education    PT Goals (Current goals can be found in the Care Plan section)  Acute Rehab PT Goals Patient Stated Goal: decrease pain PT Goal Formulation: With patient Time For Goal Achievement: 12/17/16 Potential to Achieve Goals: Good    Frequency Min 3X/week   Barriers to discharge        Co-evaluation               End of Session   Activity Tolerance: Patient limited by pain Patient left: in chair;with call bell/phone within reach;with chair alarm set Nurse Communication: Mobility status         Time: [id number] PT Time Calculation (min) (ACUTE ONLY): 12 min   Charges:   PT Evaluation $PT Eval Low Complexity: 1 Procedure     PT G Codes:        Lawrence Barajas 12/03/2016, 9:20 AM

## 2016-12-03 NOTE — Progress Notes (Signed)
[facility] PROGRESS NOTE  Lawrence MustacheDarren Barajas ZOX:[id number] DOB: 01/18/1961 DOA: 12/02/2016  PCP: Pcp Not In System  Brief History/Interval Summary: 55 y.o. male with a past medical history of hepatitis C, schizoaffective disorder, chronic lymphedema of the lower extremities who was hospitalized at [facility] earlier this month for hematemesis. He underwent upper endoscopy there which revealed a Mallory-Weiss tear. Patient also has a history of alcohol abuse. Presented with complaints of weight gain and scrotal edema, swelling and discomfort. Found to have anasarca and hepatic encephalopathy. Was hospitalized for further management.  Reason for Visit: Hepatic encephalopathy. Anasarca.  Consultants: None  Procedures: None  Antibiotics: None  Subjective/Interval History: Patient remains confused. Unable to obtain much information from him.  ROS: Unable to do due to mental confusion.  Objective:  Vital Signs  Vitals:   12/02/16 1833 12/02/16 2151 12/03/16 0500 12/03/16 0525  BP: (!) 154/73 (!) 157/72  (!) 156/76  Pulse: 70 86  72  Resp: 20 18  18   Temp: 97.9 F (36.6 C) 98 F (36.7 C)  97.8 F (36.6 C)  TempSrc: Oral Oral  Oral  SpO2: 100% 100%  97%  Weight: (!) 168.9 kg (372 lb 5.7 oz)  (!) 170.5 kg (375 lb 14.2 oz)   Height: 6\' 3"  (1.905 m)       Intake/Output Summary (Last 24 hours) at 12/03/16 1146 Last data filed at 12/03/16 1056  Gross per 24 hour  Intake              580 ml  Output             2850 ml  Net            -2270 ml   Filed Weights   12/02/16 1426 12/02/16 1833 12/03/16 0500  Weight: (!) 167.8 kg (370 lb) (!) 168.9 kg (372 lb 5.7 oz) (!) 170.5 kg (375 lb 14.2 oz)    General appearance: alert, distracted and no distress Resp: clear to auscultation bilaterally Cardio: regular rate and rhythm, S1, S2 normal, no murmur, click, rub or gallop GI: Obese. Nontender. No masses or organomegaly. Bowel sounds are present. Extremities:  Chronic lymphedema bilateral lower extremities. Neurologic: Awake, alert, distracted. Confused. Asterixis is present. No focal neurological deficits.  Lab Results:  Data Reviewed: I have personally reviewed following labs and imaging studies  CBC:  Recent Labs Lab 12/02/16 1517 12/03/16 0526  WBC 15.3* 9.7  NEUTROABS 9.6*  --   HGB 10.6* 10.1*  HCT 29.9* 28.4*  MCV 96.8 96.9  PLT 173 161    Basic Metabolic Panel:  Recent Labs Lab 12/02/16 1517 12/02/16 1842 12/03/16 0526  NA 139  --  139  K 3.6  --  3.3*  CL 108  --  109  CO2 24  --  24  GLUCOSE 96  --  89  BUN 14  --  13  CREATININE 1.18  --  1.00  CALCIUM 8.7*  --  8.4*  MG  --  1.7 1.7    GFR: Estimated Creatinine Clearance: 140.4 mL/min (by C-G formula based on SCr of 1 mg/dL).  Liver Function Tests:  Recent Labs Lab 12/02/16 1517 12/03/16 0526  AST 90* 82*  ALT 37 34  ALKPHOS 154* 133*  BILITOT 6.5* 5.7*  PROT 7.8 7.3  ALBUMIN 2.1* 1.9*     Recent Labs Lab 12/02/16 1517  LIPASE 25    Recent Labs Lab 12/02/16 1519 12/03/16 0526  AMMONIA 83* 106*  Coagulation Profile:  Recent Labs Lab 12/02/16 1842  INR 2.04    Thyroid Function Tests:  Recent Labs  12/02/16 1842  TSH 1.882     Radiology Studies: Dg Chest 2 View  Result Date: 12/02/2016 CLINICAL DATA:  Abdominal pain, history of pancreatitis, chronic alcohol abuse with recent drinking. EXAM: CHEST  2 VIEW COMPARISON:  None in PACs FINDINGS: The lungs are adequately inflated. The interstitial markings are coarse. The cardiac silhouette is enlarged and the pulmonary vascularity is engorged. There is no discrete infiltrate. There is no pleural effusion or pneumothorax. The mediastinum is normal in width. The observed bony thorax exhibits no acute abnormality. IMPRESSION: Cardiomegaly with pulmonary vascular congestion and interstitial edema consistent with mild CHF. No alveolar pneumonia. Electronically Signed   By: David   [place] M.D.   On: 12/02/2016 15:44   US Scrotum  Result Date: 12/02/2016 CLINICAL DATA:  Bilateral scrotal pain and swelling for several days EXAM: SCROTAL ULTRASOUND DOPPLER ULTRASOUND OF THE TESTICLES TECHNIQUE: Complete ultrasound examination of the testicles, epididymis, and other scrotal structures was performed. Color and spectral Doppler ultrasound were also utilized to evaluate blood flow to the testicles. COMPARISON:  None. FINDINGS: Right testicle Measurements: 4.4 x 2.6 x 2.3 cm. No mass or microlithiasis visualized. Left testicle Measurements: 3.5 x 2.0 x 3.1 cm. No mass or microlithiasis visualized. Right epididymis:  Not visible Left epididymis:  Not visible Hydrocele:  None Varicocele:  None visible. Pulsed Doppler interrogation of both testes demonstrates normal low resistance arterial and venous waveforms bilaterally. There is scrotal skin thickening/edema bilaterally. IMPRESSION: Normal testes. No testicular mass or torsion. No abnormal scrotal fluid collections. Extensive scrotal skin thickening/edema. Electronically Signed   By: Ellery Plunk M.D.   On: 12/02/2016 21:19   [place] Art/ven Flow Abd Pelv Doppler  Result Date: 12/02/2016 CLINICAL DATA:  Bilateral scrotal pain and swelling for several days EXAM: SCROTAL ULTRASOUND DOPPLER ULTRASOUND OF THE TESTICLES TECHNIQUE: Complete ultrasound examination of the testicles, epididymis, and other scrotal structures was performed. Color and spectral Doppler ultrasound were also utilized to evaluate blood flow to the testicles. COMPARISON:  None. FINDINGS: Right testicle Measurements: 4.4 x 2.6 x 2.3 cm. No mass or microlithiasis visualized. Left testicle Measurements: 3.5 x 2.0 x 3.1 cm. No mass or microlithiasis visualized. Right epididymis:  Not visible Left epididymis:  Not visible Hydrocele:  None Varicocele:  None visible. Pulsed Doppler interrogation of both testes demonstrates normal low resistance arterial and venous waveforms  bilaterally. There is scrotal skin thickening/edema bilaterally. IMPRESSION: Normal testes. No testicular mass or torsion. No abnormal scrotal fluid collections. Extensive scrotal skin thickening/edema. Electronically Signed   By: Ellery Plunk M.D.   On: 12/02/2016 21:19     Medications:  Scheduled: . folic acid  1 mg Oral Daily  . furosemide  60 mg Intravenous Once  . furosemide  60 mg Intravenous Q12H  . lactulose  20 g Oral TID  . LORazepam  0-4 mg Oral Q6H   Followed by  . [START ON 12/04/2016] LORazepam  0-4 mg Oral Q12H  . multivitamin with minerals  1 tablet Oral Daily  . pantoprazole  40 mg Oral BID  . sertraline  100 mg Oral Daily  . sodium chloride flush  3 mL Intravenous Q12H  . sodium chloride flush  3 mL Intravenous Q12H  . thiamine  100 mg Oral Daily   Continuous:  ZOX:WRUEAV chloride, acetaminophen **OR** acetaminophen, LORazepam **OR** LORazepam, sodium chloride flush  Assessment/Plan:  Principal Problem:  Anasarca Active Problems:   Edema of abdominal wall   Hepatic encephalopathy (HCC)   Hepatitis C   Schizoaffective disorder (HCC)   Alcohol abuse   Coagulopathy (HCC)   Scrotal edema    Anasarca, most likely secondary to liver disease and hypoalbuminemia Patient also has chronic lower extremity lymphedema. He has gained 40-50 pounds over the last few weeks. He does have significant scrotal edema. Patient initiated on IV Lasix. He is diuresing well. Monitor weights. Foley catheter placed for strict ins and outs. Fluid restriction. According to care-everywhere patient underwent a CT scan and an ultrasound of his abdomen earlier this month at [facility]. Only trace ascites was noted at that time.  Scrotal edema and pain Scrotal ultrasound does not show any testicular torsion. Pain is most likely secondary to the swelling. This is most likely due to his anasarca.   Hepatic encephalopathy Continues to have asterixis. Was given lactulose yesterday.  Ammonia level has risen. Increase dose of lactulose and add rifaximin.  History of alcohol abuse Current use is not known. Continue CIWA protocol. Thiamine, multivitamins and folic acid.   Abnormal EKG EKG done in the emergency department was read by the computer as atrial fibrillation, though it appeared to be sinus rhythm. EKG will need to be repeated. Has not been done yet this morning. QT interval was noted to be prolonged. Magnesium was 1.7.  History of hepatitis C with hyperbilirubinemia and coagulopathy INR is 2. Monitor his liver function periodically.  Possible acute on chronic diastolic CHF Based on an echocardiogram done at [facility] earlier this month he does have normal systolic function. Lasix as discussed above. Ins and outs and daily weights. Monitor renal function while he is getting diuresed.  History of schizoaffective disorder Due to his altered mental status and prolonged QT we will hold the Seroquel for now.  Normocytic anemia Monitor hemoglobin closely. No evidence for any bleeding at this time.  DVT Prophylaxis: Patient is coagulopathic    Code Status: Full code  Family Communication: No family at bedside  Disposition Plan: Management as outlined above. Mobilize.    LOS: 1 day   [facility] System [facility] Jamaica PlainKRISHNAN,Khamron Gellert  [facility] Pager [phone] 12/03/2016, 11:46 AM  If 7PM-7AM, please contact night-coverage at [email or web address], password [email or web address]

## 2016-12-04 DIAGNOSIS — F1010 Alcohol abuse, uncomplicated: Secondary | ICD-10-CM

## 2016-12-04 DIAGNOSIS — K7290 Hepatic failure, unspecified without coma: Secondary | ICD-10-CM

## 2016-12-04 DIAGNOSIS — D689 Coagulation defect, unspecified: Secondary | ICD-10-CM

## 2016-12-04 DIAGNOSIS — F259 Schizoaffective disorder, unspecified: Secondary | ICD-10-CM

## 2016-12-04 DIAGNOSIS — N5089 Other specified disorders of the male genital organs: Secondary | ICD-10-CM

## 2016-12-04 DIAGNOSIS — R600 Localized edema: Secondary | ICD-10-CM

## 2016-12-04 DIAGNOSIS — R601 Generalized edema: Secondary | ICD-10-CM

## 2016-12-04 LAB — COMPREHENSIVE METABOLIC PANEL
ALT: 31 U/L (ref 17–63)
AST: 75 U/L — AB (ref 15–41)
Albumin: 1.8 g/dL — ABNORMAL LOW (ref 3.5–5.0)
Alkaline Phosphatase: 124 U/L (ref 38–126)
Anion gap: 4 — ABNORMAL LOW (ref 5–15)
BILIRUBIN TOTAL: 5 mg/dL — AB (ref 0.3–1.2)
BUN: 11 mg/dL (ref 6–20)
CHLORIDE: 109 mmol/L (ref 101–111)
CO2: 25 mmol/L (ref 22–32)
CREATININE: 1.07 mg/dL (ref 0.61–1.24)
Calcium: 8.3 mg/dL — ABNORMAL LOW (ref 8.9–10.3)
GFR calc Af Amer: >60 mL/min (ref >60)
Glucose, Bld: 104 mg/dL — ABNORMAL HIGH (ref 65–99)
Potassium: 3.1 mmol/L — ABNORMAL LOW (ref 3.5–5.1)
Sodium: 138 mmol/L (ref 135–145)
Total Protein: 6.9 g/dL (ref 6.5–8.1)

## 2016-12-04 LAB — CBC
HEMATOCRIT: 28 % — AB (ref 39.0–52.0)
HEMOGLOBIN: 10.1 g/dL — AB (ref 13.0–17.0)
MCH: 34.7 pg — AB (ref 26.0–34.0)
MCHC: 36.1 g/dL — ABNORMAL HIGH (ref 30.0–36.0)
MCV: 96.2 fL (ref 78.0–100.0)
Platelets: 154 10*3/uL (ref 150–400)
RBC: 2.91 MIL/uL — AB (ref 4.22–5.81)
RDW: 16.8 % — ABNORMAL HIGH (ref 11.5–15.5)
WBC: 9.3 10*3/uL (ref 4.0–10.5)

## 2016-12-04 LAB — URINALYSIS, ROUTINE W REFLEX MICROSCOPIC
Bacteria, UA: NONE SEEN
Bilirubin Urine: NEGATIVE
Glucose, UA: NEGATIVE mg/dL
KETONES UR: NEGATIVE mg/dL
Leukocytes, UA: NEGATIVE
Nitrite: NEGATIVE
PH: 7 (ref 5.0–8.0)
PROTEIN: NEGATIVE mg/dL
SQUAMOUS EPITHELIAL / LPF: NONE SEEN
Specific Gravity, Urine: 1.011 (ref 1.005–1.030)

## 2016-12-04 LAB — RAPID URINE DRUG SCREEN, HOSP PERFORMED
Amphetamines: NOT DETECTED
BENZODIAZEPINES: NOT DETECTED
Barbiturates: NOT DETECTED
COCAINE: NOT DETECTED
Opiates: NOT DETECTED
Tetrahydrocannabinol: NOT DETECTED

## 2016-12-04 LAB — MAGNESIUM: MAGNESIUM: 1.9 mg/dL (ref 1.7–2.4)

## 2016-12-04 LAB — AMMONIA: AMMONIA: 122 umol/L — AB (ref 9–35)

## 2016-12-04 MED ORDER — POTASSIUM CHLORIDE CRYS ER 20 MEQ PO TBCR
60.0000 meq | EXTENDED_RELEASE_TABLET | Freq: Four times a day (QID) | ORAL | Status: AC
Start: 2016-12-04 — End: 2016-12-04
  Administered 2016-12-04 (×2): 60 meq via ORAL
  Filled 2016-12-04 (×2): qty 3

## 2016-12-04 MED ORDER — LACTULOSE 10 GM/15ML PO SOLN
30.0000 g | Freq: Four times a day (QID) | ORAL | Status: DC
  Administered 2016-12-04 – 2016-12-06 (×9): 30 g via ORAL
  Filled 2016-12-04 (×9): qty 45

## 2016-12-04 MED ORDER — POTASSIUM CHLORIDE CRYS ER 20 MEQ PO TBCR
30.0000 meq | EXTENDED_RELEASE_TABLET | Freq: Once | ORAL | Status: AC
  Administered 2016-12-04: 30 meq via ORAL
  Filled 2016-12-04: qty 1

## 2016-12-04 NOTE — Progress Notes (Signed)
[facility] PROGRESS NOTE  Lolly MustacheDarren Gendreau [id number] DOB: 03/03/1961 DOA: 12/02/2016  PCP: Pcp Not In System  Brief History/Interval Summary: 55 y.o. male with a past medical history of hepatitis C, schizoaffective disorder, chronic lymphedema of the lower extremities who was hospitalized at [facility] earlier this month for hematemesis. He underwent upper endoscopy there which revealed a Mallory-Weiss tear. Patient also has a history of alcohol abuse. Presented with complaints of weight gain and scrotal edema, swelling and discomfort. Found to have anasarca and hepatic encephalopathy. Was hospitalized for further management.  Reason for Visit: Hepatic encephalopathy. Anasarca.  Consultants: None  Procedures: None  Antibiotics: None  Subjective/Interval History: Patient was lethargic this morning, apparently he was given Ativan right is 7 AM. Discussed with the nursing staff, probably had one bowel movement since last night. I will increase his lactulose.  ROS: Unable to do due to mental confusion.  Objective:  Vital Signs  Vitals:   12/03/16 0525 12/03/16 1314 12/03/16 2039 12/04/16 0507  BP: (!) 156/76 (!) 155/71 (!) 150/72 (!) 170/84  Pulse: 72 74 78 71  Resp: 18 20 18 20   Temp: 97.8 F (36.6 C) 98 F (36.7 C) 97.9 F (36.6 C) 97.5 F (36.4 C)  TempSrc: Oral Oral Oral Oral  SpO2: 97% 100% 100% 100%  Weight:      Height:        Intake/Output Summary (Last 24 hours) at 12/04/16 1321 Last data filed at 12/04/16 [id number]  Gross per 24 hour  Intake              240 ml  Output             3275 ml  Net            -3035 ml   Filed Weights   12/02/16 1426 12/02/16 1833 12/03/16 0500  Weight: (!) 167.8 kg (370 lb) (!) 168.9 kg (372 lb 5.7 oz) (!) 170.5 kg (375 lb 14.2 oz)    General appearance: alert, distracted and no distress Resp: clear to auscultation bilaterally Cardio: regular rate and rhythm, S1, S2 normal, no murmur, click, rub or  gallop GI: Obese. Nontender. No masses or organomegaly. Bowel sounds are present. Extremities: Chronic lymphedema bilateral lower extremities. Neurologic: Awake, alert, distracted. Confused. Asterixis is present. No focal neurological deficits.  Lab Results:  Data Reviewed: I have personally reviewed following labs and imaging studies  CBC:  Recent Labs Lab 12/02/16 1517 12/03/16 0526 12/04/16 0144  WBC 15.3* 9.7 9.3  NEUTROABS 9.6*  --   --   HGB 10.6* 10.1* 10.1*  HCT 29.9* 28.4* 28.0*  MCV 96.8 96.9 96.2  PLT 173 161 154    Basic Metabolic Panel:  Recent Labs Lab 12/02/16 1517 12/02/16 1842 12/03/16 0526 12/04/16 0144  NA 139  --  139 138  K 3.6  --  3.3* 3.1*  CL 108  --  109 109  CO2 24  --  24 25  GLUCOSE 96  --  89 104*  BUN 14  --  13 11  CREATININE 1.18  --  1.00 1.07  CALCIUM 8.7*  --  8.4* 8.3*  MG  --  1.7 1.7 1.9    GFR: Estimated Creatinine Clearance: 131.2 mL/min (by C-G formula based on SCr of 1.07 mg/dL).  Liver Function Tests:  Recent Labs Lab 12/02/16 1517 12/03/16 0526 12/04/16 0144  AST 90* 82* 75*  ALT 37 34 31  ALKPHOS 154* 133* 124  BILITOT  6.5* 5.7* 5.0*  PROT 7.8 7.3 6.9  ALBUMIN 2.1* 1.9* 1.8*     Recent Labs Lab 12/02/16 1517  LIPASE 25    Recent Labs Lab 12/02/16 1519 12/03/16 0526 12/04/16 0144  AMMONIA 83* 106* 122*    Coagulation Profile:  Recent Labs Lab 12/02/16 1842  INR 2.04    Thyroid Function Tests:  Recent Labs  12/02/16 1842  TSH 1.882     Radiology Studies: Dg Chest 2 View  Result Date: 12/02/2016 CLINICAL DATA:  Abdominal pain, history of pancreatitis, chronic alcohol abuse with recent drinking. EXAM: CHEST  2 VIEW COMPARISON:  None in PACs FINDINGS: The lungs are adequately inflated. The interstitial markings are coarse. The cardiac silhouette is enlarged and the pulmonary vascularity is engorged. There is no discrete infiltrate. There is no pleural effusion or pneumothorax. The  mediastinum is normal in width. The observed bony thorax exhibits no acute abnormality. IMPRESSION: Cardiomegaly with pulmonary vascular congestion and interstitial edema consistent with mild CHF. No alveolar pneumonia. Electronically Signed   By: David  [place] M.D.   On: 12/02/2016 15:44   [place] Scrotum  Result Date: 12/02/2016 CLINICAL DATA:  Bilateral scrotal pain and swelling for several days EXAM: SCROTAL ULTRASOUND DOPPLER ULTRASOUND OF THE TESTICLES TECHNIQUE: Complete ultrasound examination of the testicles, epididymis, and other scrotal structures was performed. Color and spectral Doppler ultrasound were also utilized to evaluate blood flow to the testicles. COMPARISON:  None. FINDINGS: Right testicle Measurements: 4.4 x 2.6 x 2.3 cm. No mass or microlithiasis visualized. Left testicle Measurements: 3.5 x 2.0 x 3.1 cm. No mass or microlithiasis visualized. Right epididymis:  Not visible Left epididymis:  Not visible Hydrocele:  None Varicocele:  None visible. Pulsed Doppler interrogation of both testes demonstrates normal low resistance arterial and venous waveforms bilaterally. There is scrotal skin thickening/edema bilaterally. IMPRESSION: Normal testes. No testicular mass or torsion. No abnormal scrotal fluid collections. Extensive scrotal skin thickening/edema. Electronically Signed   By: Ellery Plunkaniel R Mitchell M.D.   On: 12/02/2016 21:19   [place] Art/ven Flow Abd Pelv Doppler  Result Date: 12/02/2016 CLINICAL DATA:  Bilateral scrotal pain and swelling for several days EXAM: SCROTAL ULTRASOUND DOPPLER ULTRASOUND OF THE TESTICLES TECHNIQUE: Complete ultrasound examination of the testicles, epididymis, and other scrotal structures was performed. Color and spectral Doppler ultrasound were also utilized to evaluate blood flow to the testicles. COMPARISON:  None. FINDINGS: Right testicle Measurements: 4.4 x 2.6 x 2.3 cm. No mass or microlithiasis visualized. Left testicle Measurements: 3.5 x 2.0 x 3.1 cm. No  mass or microlithiasis visualized. Right epididymis:  Not visible Left epididymis:  Not visible Hydrocele:  None Varicocele:  None visible. Pulsed Doppler interrogation of both testes demonstrates normal low resistance arterial and venous waveforms bilaterally. There is scrotal skin thickening/edema bilaterally. IMPRESSION: Normal testes. No testicular mass or torsion. No abnormal scrotal fluid collections. Extensive scrotal skin thickening/edema. Electronically Signed   By: Ellery Plunkaniel R Mitchell M.D.   On: 12/02/2016 21:19     Medications:  Scheduled: . folic acid  1 mg Oral Daily  . furosemide  60 mg Intravenous Once  . furosemide  60 mg Intravenous Q12H  . lactulose  30 g Oral TID  . multivitamin with minerals  1 tablet Oral Daily  . pantoprazole  40 mg Oral BID  . rifaximin  550 mg Oral TID  . sertraline  100 mg Oral Daily  . sodium chloride flush  3 mL Intravenous Q12H  . sodium chloride flush  3 mL Intravenous Q12H  .  thiamine  100 mg Oral Daily   Continuous:  ZOX:WRUEAV chloride, acetaminophen **OR** acetaminophen, LORazepam **OR** LORazepam, sodium chloride flush  Assessment/Plan:  Principal Problem:   Anasarca Active Problems:   Edema of abdominal wall   Hepatic encephalopathy (HCC)   Hepatitis C   Schizoaffective disorder (HCC)   Alcohol abuse   Coagulopathy (HCC)   Scrotal edema    Anasarca, most likely secondary to liver disease and hypoalbuminemia Patient also has chronic lower extremity lymphedema. He has gained 40-50 pounds over the last few weeks.  He does have significant scrotal edema.  Patient initiated on IV Lasix. He is diuresing well. Monitor weights. Foley catheter placed for strict ins and outs. Fluid restriction.  Continue current IV diuresis.  Scrotal edema and pain Scrotal ultrasound does not show any testicular torsion. Pain is most likely secondary to the swelling. This is most likely due to his anasarca.   Hepatic encephalopathy Continues to  be confused with elevated pneumonia, on lactulose but bowel movement still inadequate. Increase lactulose dose, rifaximin added.  History of alcohol abuse Current use is not known. Continue CIWA protocol. Thiamine, multivitamins and folic acid.   Abnormal EKG EKG done in the emergency department was read by the computer as atrial fibrillation, though it appeared to be sinus rhythm. EKG will need to be repeated. Has not been done yet this morning. QT interval was noted to be prolonged. Magnesium was 1.7.  History of hepatitis C with hyperbilirubinemia and coagulopathy INR is 2. Monitor his liver function periodically.  Possible acute on chronic diastolic CHF Based on an echocardiogram done at [facility] earlier this month he does have normal systolic function. Lasix as discussed above. Ins and outs and daily weights. Monitor renal function while he is getting diuresed.  History of schizoaffective disorder Due to his altered mental status and prolonged QT we will hold the Seroquel for now.  Normocytic anemia Monitor hemoglobin closely. No evidence for any bleeding at this time.  Hypokalemia -This is secondary to aggressive diuresis, replete with oral supplements.  DVT Prophylaxis: Patient is coagulopathic  , baseline INR is 2.0  Code Status: Full code  Family Communication: No family at bedside  Disposition Plan: Management as outlined above. Mobilize.    LOS: 2 days   [facility]  [facility] Pager [phone] 12/04/2016, 1:21 PM  If 7PM-7AM, please contact night-coverage at [email or web address], password [facility]

## 2016-12-04 NOTE — Evaluation (Signed)
Occupational Therapy Evaluation Patient Details Name: Lawrence Barajas MRN: [id number] DOB: 07/13/1961 Today's Date: 12/04/2016    History of Present Illness Lawrence Barajas is a 55 y.o. male with a past medical history of hepatitis C, schizoaffective disorder, chronic lymphedema of the lower extremities who was hospitalized [facility] earlier this month for hematemesis. Apparently he's had some scrotal pain and scrotal swelling ongoing for a few days. Apparently he has also gained about 50-60 pounds in the last couple of weeks. patient was found to be jaundiced, he was found to have anasarca. He was found to have elevated ammonia level. He is thought to have hepatic encephalopathy   Clinical Impression   Pt unable to provide PLOF information this session due to lethargy and impaired cognition. Pt impulsive with sit to stand from EOB x2 with min assist but unable to sequence stand [facility] despite max multimodal cues. Pt presenting with decreased safety awareness, intermittently following one step commands, lethargy, poor standing balance impacting his independence and safety with ADL and functional mobility. At this time, recommending SNF for follow up to maximize independence and safety with ADL and functional mobility prior to return to home. Pending progress and improvement in cognition; pt could potentially d/c home. Pt would benefit from continued skilled OT to address established goals.    Follow Up Recommendations  SNF;Supervision/Assistance - 24 hour    Equipment Recommendations  Other (comment) (TBD)    Recommendations for Other Services       Precautions / Restrictions Precautions Precautions: Fall Restrictions Weight Bearing Restrictions: No      Mobility Bed Mobility Overal bed mobility: Needs Assistance Bed Mobility: Supine to Sit;Sit to Supine     Supine to sit: Min guard;HOB elevated Sit to supine: Min guard;HOB elevated   General  bed mobility comments: Min guard for safety with increased time required. HOB elevated with use of bed rail.  Transfers Overall transfer level: Needs assistance Equipment used: None Transfers: Sit to/from Stand Sit to Stand: Min assist         General transfer comment: Min assist for steadying balance once in standing. Pt impulsive with sit to stand from EOB. Unable to sequence stand pivot transfer despite max multimodal cues.    Balance Overall balance assessment: Needs assistance Sitting-balance support: Feet supported;Bilateral upper extremity supported Sitting balance-Leahy Scale: Fair     Standing balance support: No upper extremity supported Standing balance-Leahy Scale: Poor                              ADL Overall ADL's : Needs assistance/impaired   Eating/Feeding Details (indicate cue type and reason): Present when RN giving meds. Pt requires max cues to sequence sip of drink and swallow.                                   General ADL Comments: Pt required min assist for balance with sit to stand from EOB x2. Pt unable to sequence stand pivot to [facility] despite max multimodal cues. Pt would require total assist for ADL at this time due to level of arousal and confusion.     Vision     Perception     Praxis      Pertinent Vitals/Pain Pain Assessment: Faces Faces Pain Scale: Hurts little more     Hand Dominance  Extremity/Trunk Assessment Upper Extremity Assessment Upper Extremity Assessment: Overall WFL for tasks assessed   Lower Extremity Assessment Lower Extremity Assessment: Defer to PT evaluation       Communication Communication Communication: Other (comment) (mumbling, difficult to understand)   Cognition Arousal/Alertness: Lethargic Behavior During Therapy: Impulsive;Flat affect Overall Cognitive Status: Impaired/Different from baseline Area of Impairment: Following commands;Safety/judgement;Problem solving        Following Commands: Follows one step commands inconsistently Safety/Judgement: Decreased awareness of safety   Problem Solving: Decreased initiation;Difficulty sequencing;Requires verbal cues;Requires tactile cues General Comments: Pt impulsive with standing at EOB, once in standing could not sequence [facility] despite max multimodal cues. Pt lethargic throughout session and unable to answer orientation questions.   General Comments       Exercises       Shoulder Instructions      Home Living Family/patient expects to be discharged to:: Group home (drug rehab)                                        Prior Functioning/Environment Level of Independence: Independent        Comments: Pt unable to provide home set up or PLOF information due to impaired cognition and level of arousal. Information obtained from PT eval.        OT Problem List: Impaired balance (sitting and/or standing);Decreased cognition;Decreased safety awareness;Decreased knowledge of use of DME or AE;Decreased knowledge of precautions;Obesity;Increased edema   OT Treatment/Interventions: Self-care/ADL training;DME and/or AE instruction;Therapeutic activities;Patient/family education;Balance training    OT Goals(Current goals can be found in the care plan section) Acute Rehab OT Goals Patient Stated Goal: none stated OT Goal Formulation: Patient unable to participate in goal setting Time For Goal Achievement: 12/18/16 Potential to Achieve Goals: Good ADL Goals Pt Will Perform Grooming: with supervision;sitting Pt Will Transfer to Toilet: with min guard assist;ambulating;bedside commode Additional ADL Goal #1: Pt will follow one step command 100% of the time in a minimally distracting enironment.  OT Frequency: Min 2X/week   Barriers to D/C:            Co-evaluation              End of Session Nurse Communication: Mobility status (RN present when attempting  transfer)  Activity Tolerance: Patient limited by lethargy Patient left: in bed;with call bell/phone within reach;with bed alarm set;with nursing/sitter in room   Time: 4696-29521642-1657 OT Time Calculation (min): 15 min Charges:  OT General Charges $OT Visit: 1 Procedure OT Evaluation $OT Eval Moderate Complexity: 1 Procedure G-Codes:     Gaye AlkenBailey A Zakiyah Diop M.S., OTR/L Pager: [phone]: [phone]  12/04/2016, 5:08 PM

## 2016-12-04 NOTE — Progress Notes (Signed)
Third run of V. Tach. Patient appears to be resting comfortably. Donnamarie PoagK. Kirby paged. Will continue to monitor pt.

## 2016-12-04 NOTE — Progress Notes (Signed)
Spoke with Clayburn PertEvan RN about PICC line order. Patient has working IV . Please have MD reassess need for central line at this time.

## 2016-12-04 NOTE — Progress Notes (Signed)
Spoke to PepsiCoEvan RN, state he will re-page the MD to determine if the PICC line still needs to be placed and up date the IV team request accordingly. Consuello Masseimmons, Bonna Steury M

## 2016-12-05 LAB — RENAL FUNCTION PANEL
Albumin: 2.1 g/dL — ABNORMAL LOW (ref 3.5–5.0)
Anion gap: 7 (ref 5–15)
BUN: 8 mg/dL (ref 6–20)
CHLORIDE: 107 mmol/L (ref 101–111)
CO2: 24 mmol/L (ref 22–32)
CREATININE: 1.14 mg/dL (ref 0.61–1.24)
Calcium: 8.6 mg/dL — ABNORMAL LOW (ref 8.9–10.3)
GFR calc Af Amer: >60 mL/min (ref >60)
GFR calc non Af Amer: >60 mL/min (ref >60)
GLUCOSE: 91 mg/dL (ref 65–99)
Phosphorus: 3 mg/dL (ref 2.5–4.6)
Potassium: 3.4 mmol/L — ABNORMAL LOW (ref 3.5–5.1)
Sodium: 138 mmol/L (ref 135–145)

## 2016-12-05 MED ORDER — POTASSIUM CHLORIDE CRYS ER 20 MEQ PO TBCR
60.0000 meq | EXTENDED_RELEASE_TABLET | Freq: Four times a day (QID) | ORAL | Status: AC
  Administered 2016-12-05 (×2): 60 meq via ORAL
  Filled 2016-12-05 (×2): qty 3

## 2016-12-05 NOTE — Progress Notes (Signed)
Occupational Therapy Treatment Patient Details Name: Lawrence MustacheDarren Fasnacht MRN: [id number] DOB: 09/10/1961 Today's Date: 12/05/2016    History of present illness Lawrence Barajas is a 55 y.o. male with a past medical history of hepatitis C, schizoaffective disorder, chronic lymphedema of the lower extremities who was [facility] month for hematemesis. Apparently he's had some scrotal pain and scrotal swelling ongoing for a few days. Apparently he has also gained about 50-60 pounds in the last couple of weeks. patient was found to be jaundiced, he was found to have anasarca. He was found to have elevated ammonia level. He is thought to have hepatic encephalopathy   OT comments  Pt more alert today and following commands however he continues to be impulsive with transfers. Pt required min guard assist for toilet transfer, max assist for LB dressing and bathing/pericare following BM. D/c plan remains appropriate. Will continue to follow acutely.   Follow Up Recommendations  SNF;Supervision/Assistance - 24 hour    Equipment Recommendations  Other (comment) (TBD)    Recommendations for Other Services      Precautions / Restrictions Precautions Precautions: Fall Restrictions Weight Bearing Restrictions: No       Mobility Bed Mobility               General bed mobility comments: Pt sitting EOB upon arrival.  Transfers Overall transfer level: Needs assistance Equipment used: Rolling walker (2 wheeled) Transfers: Sit to/from Stand Sit to Stand: Min guard;Supervision         General transfer comment: Cues for hand placement min guard for safety.  Pt presents with poor eccentric loading.      Balance Overall balance assessment: Needs assistance Sitting-balance support: Feet supported;No upper extremity supported Sitting balance-Leahy Scale: Fair     Standing balance support: No upper extremity supported;During functional activity Standing  balance-Leahy Scale: Poor                     ADL Overall ADL's : Needs assistance/impaired             Lower Body Bathing: Maximal assistance;Sit to/from stand Lower Body Bathing Details (indicate cue type and reason): Max assist to clean peri area and bottom following BM.     Lower Body Dressing: Total assistance Lower Body Dressing Details (indicate cue type and reason): to don socks Toilet Transfer: Min guard;Ambulation;Regular Toilet;Grab bars;RW   Toileting- Clothing Manipulation and Hygiene: Maximal assistance;Sit to/from stand       Functional mobility during ADLs: Min guard;Rolling walker General ADL Comments: Pt continues to be impulsive with transfers but less confused and more alert today.       Vision                     Perception     Praxis      Cognition   Behavior During Therapy: Impulsive Overall Cognitive Status: Within Functional Limits for tasks assessed                       Extremity/Trunk Assessment               Exercises     Shoulder Instructions       General Comments      Pertinent Vitals/ Pain       Pain Assessment: Faces Faces Pain Scale: Hurts a little bit Pain Location: scrotum Pain Descriptors / Indicators: Discomfort Pain Intervention(s): Monitored during session;Repositioned  Home Living  Prior Functioning/Environment              Frequency  Min 2X/week        Progress Toward Goals  OT Goals(current goals can now be found in the care plan section)  Progress towards OT goals: Progressing toward goals  Acute Rehab OT Goals Patient Stated Goal: none stated OT Goal Formulation: With patient  Plan Discharge plan remains appropriate    Co-evaluation    PT/OT/SLP Co-Evaluation/Treatment: Yes Reason for Co-Treatment: For patient/therapist safety   OT goals addressed during session: ADL's and self-care      End  of Session Equipment Utilized During Treatment: Rolling walker   Activity Tolerance Patient tolerated treatment well   Patient Left in chair;with call bell/phone within reach;with chair alarm set   Nurse Communication Mobility status        Time: 1191-47821609-1632 OT Time Calculation (min): 23 min  Charges: OT General Charges $OT Visit: 1 Procedure OT Treatments $Self Care/Home Management : 8-22 mins  Gaye AlkenBailey A Nicosha Struve M.S., OTR/L Pager: [phone]  12/05/2016, 4:53 PM

## 2016-12-05 NOTE — Progress Notes (Signed)
[facility] PROGRESS NOTE  Lawrence Barajas [id number] DOB: 10/02/1961 DOA: 12/02/2016  PCP: Pcp Not In System   Subjective/Interval History: Still sleepy but definitely more awake than yesterday, attention span is still very short. Continue Xifaxan and lactulose.  Brief History/Interval Summary: 55 y.o. male with a past medical history of hepatitis C, schizoaffective disorder, chronic lymphedema of the lower extremities who was [facility] this month for hematemesis. He underwent upper endoscopy there which revealed a Mallory-Weiss tear. Patient also has a history of alcohol abuse. Presented with complaints of weight gain and scrotal edema, swelling and discomfort. Found to have anasarca and hepatic encephalopathy. Was hospitalized for further management.  Reason for Visit: Hepatic encephalopathy. Anasarca.  Consultants: None  Procedures: None  Antibiotics: None  ROS: Unable to do due to mental confusion.  Objective:  Vital Signs  Vitals:   12/04/16 0507 12/04/16 2209 12/05/16 0402 12/05/16 0500  BP: (!) 170/84 (!) 160/80  (!) 157/96  Pulse: 71 84  80  Resp: 20 17  18   Temp: 97.5 F (36.4 C) 97.9 F (36.6 C)  97.5 F (36.4 C)  TempSrc: Oral Oral  Oral  SpO2: 100% 94%  97%  Weight:   (!) 160.5 kg (353 lb 14.4 oz)   Height:        Intake/Output Summary (Last 24 hours) at 12/05/16 1427 Last data filed at 12/05/16 1317  Gross per 24 hour  Intake              360 ml  Output             2750 ml  Net            -2390 ml   Filed Weights   12/02/16 1833 12/03/16 0500 12/05/16 0402  Weight: (!) 168.9 kg (372 lb 5.7 oz) (!) 170.5 kg (375 lb 14.2 oz) (!) 160.5 kg (353 lb 14.4 oz)    General appearance: alert, distracted and no distress Resp: clear to auscultation bilaterally Cardio: regular rate and rhythm, S1, S2 normal, no murmur, click, rub or gallop GI: Obese. Nontender. No masses or organomegaly. Bowel sounds are  present. Extremities: Chronic lymphedema bilateral lower extremities. Neurologic: Awake, alert, distracted. Confused. Asterixis is present. No focal neurological deficits.  Lab Results:  Data Reviewed: I have personally reviewed following labs and imaging studies  CBC:  Recent Labs Lab 12/02/16 1517 12/03/16 0526 12/04/16 0144  WBC 15.3* 9.7 9.3  NEUTROABS 9.6*  --   --   HGB 10.6* 10.1* 10.1*  HCT 29.9* 28.4* 28.0*  MCV 96.8 96.9 96.2  PLT 173 161 154    Basic Metabolic Panel:  Recent Labs Lab 12/02/16 1517 12/02/16 1842 12/03/16 0526 12/04/16 0144 12/05/16 0926  NA 139  --  139 138 138  K 3.6  --  3.3* 3.1* 3.4*  CL 108  --  109 109 107  CO2 24  --  24 25 24   GLUCOSE 96  --  89 104* 91  BUN 14  --  13 11 8   CREATININE 1.18  --  1.00 1.07 1.14  CALCIUM 8.7*  --  8.4* 8.3* 8.6*  MG  --  1.7 1.7 1.9  --   PHOS  --   --   --   --  3.0    GFR: Estimated Creatinine Clearance: 119 mL/min (by C-G formula based on SCr of 1.14 mg/dL).  Liver Function Tests:  Recent Labs Lab 12/02/16 1517 12/03/16 0526  12/04/16 0144 12/05/16 0926  AST 90* 82* 75*  --   ALT 37 34 31  --   ALKPHOS 154* 133* 124  --   BILITOT 6.5* 5.7* 5.0*  --   PROT 7.8 7.3 6.9  --   ALBUMIN 2.1* 1.9* 1.8* 2.1*     Recent Labs Lab 12/02/16 1517  LIPASE 25    Recent Labs Lab 12/02/16 1519 12/03/16 0526 12/04/16 0144  AMMONIA 83* 106* 122*    Coagulation Profile:  Recent Labs Lab 12/02/16 1842  INR 2.04    Thyroid Function Tests:  Recent Labs  12/02/16 1842  TSH 1.882     Radiology Studies: No results found.   Medications:  Scheduled: . folic acid  1 mg Oral Daily  . furosemide  60 mg Intravenous Once  . furosemide  60 mg Intravenous Q12H  . lactulose  30 g Oral QID  . multivitamin with minerals  1 tablet Oral Daily  . pantoprazole  40 mg Oral BID  . rifaximin  550 mg Oral TID  . sertraline  100 mg Oral Daily  . sodium chloride flush  3 mL Intravenous  Q12H  . thiamine  100 mg Oral Daily   Continuous:  PRN:  Assessment/Plan:  Principal Problem:   Anasarca Active Problems:   Edema of abdominal wall   Hepatic encephalopathy (HCC)   Hepatitis C   Schizoaffective disorder (HCC)   Alcohol abuse   Coagulopathy (HCC)   Scrotal edema    Anasarca, most likely secondary to liver disease and hypoalbuminemia Patient also has chronic lower extremity lymphedema. He has gained 40-50 pounds over the last few weeks.  He does have significant scrotal edema.  Patient initiated on IV Lasix. He is diuresing well. Monitor weights. Foley catheter placed for strict ins and outs. Fluid restriction.  Continue to elevate the legs and continue IV diuresis, check daily weight.  Scrotal edema and pain Scrotal ultrasound does not show any testicular torsion. Pain is most likely secondary to the swelling.  This is most likely due to his anasarca.   Hepatic encephalopathy Continues to be confused with elevated ammonia of 122, lactulose dose increased to target for 5 bowel movements per day. Continue current lactulose and rifaximin dose  History of alcohol abuse CIWA protocol discontinued, was unsure if patient sleepy because of Ativan are because of his encephalopathy. Continue Thiamine, multivitamins and folic acid.   Abnormal EKG EKG done in the emergency department was read by the computer as atrial fibrillation, though it appeared to be sinus rhythm. EKG will need to be repeated. Has not been done yet this morning. QT interval was noted to be prolonged. Magnesium was 1.7.  History of hepatitis C with hyperbilirubinemia and coagulopathy INR is 2. Monitor his liver function periodically.  Possible acute on chronic diastolic CHF Based on an echocardiogram done at [facility] earlier this month he does have normal systolic function. Lasix as discussed above. Ins and outs and daily weights. Monitor renal function while he is  getting diuresed.  History of schizoaffective disorder Due to his altered mental status and prolonged QT we will hold the Seroquel for now.  Normocytic anemia Monitor hemoglobin closely. No evidence for any bleeding at this time.  Hypokalemia -This is secondary to aggressive diuresis, replete with oral supplements.  DVT Prophylaxis: Patient is coagulopathic  , baseline INR is 2.0  Code Status: Full code  Family Communication: No family at bedside  Disposition Plan: Management as outlined above.  Mobilize.    LOS: 3 days   [facility] Memorial Hosp & HomeELMAHI,Ahniya Mitchum A  [facility] Pager [phone] 12/05/2016, 2:27 PM  If 7PM-7AM, please contact night-coverage at [email or web address], password [email or web address]

## 2016-12-05 NOTE — Progress Notes (Addendum)
Physical Therapy Treatment Patient Details Name: Lawrence Barajas MRN: [id number] DOB: 03/08/1961 Today's Date: 12/05/2016    History of Present Illness Lawrence Barajas is a 55 y.o. male with a past medical history of hepatitis C, schizoaffective disorder, chronic lymphedema of the lower extremities who was [facility] for hematemesis. Apparently he's had some scrotal pain and scrotal swelling ongoing for a few days. Apparently he has also gained about 50-60 pounds in the last couple of weeks. patient was found to be jaundiced, he was found to have anasarca. He was found to have elevated ammonia level. He is thought to have hepatic encephalopathy    PT Comments    Pt performed increased mobility but remains unsteady and would benefit from skilled nursing facility placement to receive rehab to address these deficits.  Pt is impulsive and unsteady and would be returning to group home with out assistance.     Follow Up Recommendations  SNF     Equipment Recommendations  None recommended by PT (TBD at next venue.  )    Recommendations for Other Services       Precautions / Restrictions Precautions Precautions: Fall Restrictions Weight Bearing Restrictions: No    Mobility  Bed Mobility               General bed mobility comments: Pt sitting edge of bed on arrival attempting to stand to have BM on commode.    Transfers Overall transfer level: Needs assistance Equipment used: Rolling walker (2 wheeled) Transfers: Sit to/from Stand Sit to Stand: Min guard;Supervision         General transfer comment: Cues for hand placement min guard for safety.  Pt presents with poor eccentric loading.    Ambulation/Gait Ambulation/Gait assistance: Min guard Ambulation Distance (Feet): 10 Feet (+20 feet in hallway.  ) Assistive device: Rolling walker (2 wheeled) (would benefit from bariatric RW.  ) Gait Pattern/deviations: Step-through  pattern;Wide base of support;Decreased stride length;Shuffle Gait velocity: decreased Gait velocity interpretation: Below normal speed for age/gender General Gait Details: Waddling gait with anterior positioning of RW.  Pt performed x2 trials.  Pt limited due to discomfort from scrotal edema. Chair follow for safety.     Stairs            Wheelchair Mobility    Modified Rankin (Stroke Patients Only)       Balance     Sitting balance-Leahy Scale: Fair       Standing balance-Leahy Scale: Poor                      Cognition Arousal/Alertness: Lethargic;Awake/alert Behavior During Therapy: Impulsive Overall Cognitive Status: Within Functional Limits for tasks assessed                      Exercises      General Comments        Pertinent Vitals/Pain Pain Assessment: Faces Faces Pain Scale: Hurts a little bit Pain Location: scrotal Pain Descriptors / Indicators: Discomfort Pain Intervention(s): Monitored during session;Repositioned    Home Living                      Prior Function            PT Goals (current goals can now be found in the care plan section) Acute Rehab PT Goals Patient Stated Goal: none stated Potential to Achieve Goals: Good Progress towards PT goals: Progressing  toward goals    Frequency    Min 3X/week      PT Plan Discharge plan needs to be updated    Co-evaluation   Performed with occupational therapy to address deficits and maintain safety due to need for +2 assist.             End of Session Equipment Utilized During Treatment: Gait belt Activity Tolerance: Patient tolerated treatment well Patient left: in chair;with call bell/phone within reach;with chair alarm set     Time: 1610-96041606-1634 PT Time Calculation (min) (ACUTE ONLY): 28 min  Charges:  $Gait Training: 8-22 mins                    G Codes:      Florestine Aversimee J Tasheem Elms 12/05/2016, 4:49 PM  Joycelyn RuaAimee Inaya Gillham, PTA pager [phone]

## 2016-12-06 LAB — RENAL FUNCTION PANEL
ALBUMIN: 1.9 g/dL — AB (ref 3.5–5.0)
ANION GAP: 6 (ref 5–15)
BUN: 8 mg/dL (ref 6–20)
CALCIUM: 8.5 mg/dL — AB (ref 8.9–10.3)
CO2: 27 mmol/L (ref 22–32)
Chloride: 106 mmol/L (ref 101–111)
Creatinine, Ser: 1.2 mg/dL (ref 0.61–1.24)
GFR calc non Af Amer: >60 mL/min (ref >60)
Glucose, Bld: 88 mg/dL (ref 65–99)
PHOSPHORUS: 2.9 mg/dL (ref 2.5–4.6)
Potassium: 3.7 mmol/L (ref 3.5–5.1)
SODIUM: 139 mmol/L (ref 135–145)

## 2016-12-06 MED ORDER — LACTULOSE 10 GM/15ML PO SOLN
40.0000 g | Freq: Four times a day (QID) | ORAL | Status: DC
  Administered 2016-12-06 – 2016-12-10 (×15): 40 g via ORAL
  Filled 2016-12-06 (×17): qty 60

## 2016-12-06 MED ORDER — PNEUMOCOCCAL VAC POLYVALENT 25 MCG/0.5ML IJ INJ
0.5000 mL | INJECTION | INTRAMUSCULAR | Status: AC
  Administered 2016-12-10: 0.5 mL via INTRAMUSCULAR
  Filled 2016-12-06 (×2): qty 0.5

## 2016-12-06 MED ORDER — SPIRONOLACTONE 25 MG PO TABS
100.0000 mg | ORAL_TABLET | Freq: Every day | ORAL | Status: DC
  Administered 2016-12-06 – 2016-12-10 (×5): 100 mg via ORAL
  Filled 2016-12-06 (×5): qty 4

## 2016-12-06 MED ORDER — POTASSIUM CHLORIDE CRYS ER 20 MEQ PO TBCR
40.0000 meq | EXTENDED_RELEASE_TABLET | Freq: Every day | ORAL | Status: AC
  Administered 2016-12-06 – 2016-12-07 (×2): 40 meq via ORAL
  Filled 2016-12-06 (×2): qty 2

## 2016-12-06 NOTE — [place] FL2 (Signed)
[place] MEDICAID FL2 LEVEL OF CARE SCREENING TOOL     IDENTIFICATION  Patient Name: Lawrence MustacheDarren Rieman Birthdate: 12/03/1961 Sex: male Admission Date (Current Location): 12/02/2016  [facility] [place]   Facility and Address:  [facility] [place], [address], [place], [place] [address]      Provider Number: [id number]  Attending Physician Name and Address:  Clydia LlanoMutaz Elmahi, MD  Relative Name and Phone Number:       Current Level of Care: Hospital Recommended Level of Care: Skilled Nursing Facility Prior Approval Number:    Date Approved/Denied:   PASRR Number:    Discharge Plan: SNF    Current Diagnoses: Patient Active Problem List   Diagnosis Date Noted  . Edema of abdominal wall 12/02/2016  . Anasarca 12/02/2016  . Hepatic encephalopathy (HCC) 12/02/2016  . Hepatitis C 12/02/2016  . Schizoaffective disorder (HCC) 12/02/2016  . Alcohol abuse 12/02/2016  . Coagulopathy (HCC) 12/02/2016  . Scrotal edema 12/02/2016    Orientation RESPIRATION BLADDER Height & Weight     Self, Time, Situation, Place  Normal Continent, Indwelling catheter Weight: (!) 159.2 kg (351 lb) Height:  6\' 3"  (190.5 cm)  BEHAVIORAL SYMPTOMS/MOOD NEUROLOGICAL BOWEL NUTRITION STATUS      Continent Diet (Please see DC Summary)  AMBULATORY STATUS COMMUNICATION OF NEEDS Skin   Limited Assist Verbally                         Personal Care Assistance Level of Assistance  Bathing, Feeding, Dressing Bathing Assistance: Maximum assistance Feeding assistance: Independent Dressing Assistance: Limited assistance     Functional Limitations Info             SPECIAL CARE FACTORS FREQUENCY  PT (By licensed PT), OT (By licensed OT)     PT Frequency: 5x/week OT Frequency: 3x/week            Contractures      Additional Factors Info  Code Status, Allergies, Psychotropic Code Status Info: Full Allergies Info: NKA Psychotropic Info: Zoloft          Current Medications (12/06/2016):  This is the current hospital active medication list Current Facility-Administered Medications  Medication Dose Route Frequency Provider Last Rate Last Dose  . folic acid (FOLVITE) tablet 1 mg  1 mg Oral Daily Osvaldo ShipperGokul Krishnan, MD   1 mg at 12/06/16 0907  . furosemide (LASIX) injection 60 mg  60 mg Intravenous Once Maia PlanJoshua G Long, MD      . furosemide (LASIX) injection 60 mg  60 mg Intravenous Q12H Osvaldo ShipperGokul Krishnan, MD   60 mg at 12/06/16 0437  . lactulose (CHRONULAC) 10 GM/15ML solution 30 g  30 g Oral QID Clydia LlanoMutaz Elmahi, MD   30 g at 12/06/16 0907  . multivitamin with minerals tablet 1 tablet  1 tablet Oral Daily Osvaldo ShipperGokul Krishnan, MD   1 tablet at 12/06/16 0906  . pantoprazole (PROTONIX) EC tablet 40 mg  40 mg Oral BID Osvaldo ShipperGokul Krishnan, MD   40 mg at 12/06/16 0906  . [START ON 12/07/2016] pneumococcal 23 valent vaccine (PNU-IMMUNE) injection 0.5 mL  0.5 mL Intramuscular Tomorrow-1000 Clydia LlanoMutaz Elmahi, MD      . rifaximin (XIFAXAN) tablet 550 mg  550 mg Oral TID Osvaldo ShipperGokul Krishnan, MD   550 mg at 12/06/16 0906  . sertraline (ZOLOFT) tablet 100 mg  100 mg Oral Daily Osvaldo ShipperGokul Krishnan, MD   100 mg at 12/06/16 0907  . sodium chloride flush (  NS) 0.9 % injection 3 mL  3 mL Intravenous Q12H Osvaldo ShipperGokul Krishnan, MD   3 mL at 12/06/16 0907  . thiamine (VITAMIN B-1) tablet 100 mg  100 mg Oral Daily Osvaldo ShipperGokul Krishnan, MD   100 mg at 12/06/16 [id number]     Discharge Medications: Please see discharge summary for a list of discharge medications.  Relevant Imaging Results:  Relevant Lab Results:   Additional Information SSN: [id number]  Khalen Styer S WaleskaRayyan, [place]

## 2016-12-06 NOTE — Progress Notes (Signed)
Pasrr: [id number] Fredrich Birks   Yisel Megill LCSWA [phone]

## 2016-12-06 NOTE — Progress Notes (Signed)
[facility] PROGRESS NOTE  Lawrence Barajas ZOX:[id number] DOB: 06/19/1961 DOA: 12/02/2016  PCP: Pcp Not In System   Subjective/Interval History: Per nursing staff still has to 3 bowel movements per day, I will increase lactulose.  Brief History/Interval Summary: 55 y.o. male with a past medical history of hepatitis C, schizoaffective disorder, chronic lymphedema of the lower extremities who was [facility] earlier this month for hematemesis. He underwent upper endoscopy there which revealed a Mallory-Weiss tear. Patient also has a history of alcohol abuse. Presented with complaints of weight gain and scrotal edema, swelling and discomfort. Found to have anasarca and hepatic encephalopathy. Was hospitalized for further management.  Reason for Visit: Hepatic encephalopathy. Anasarca.  Consultants: None  Procedures: None  Antibiotics: None  ROS: Unable to do due to mental confusion.  Objective:  Vital Signs  Vitals:   12/05/16 2117 12/06/16 0153 12/06/16 0553 12/06/16 1401  BP: (!) 142/70  (!) 149/79 (!) 154/74  Pulse: 83  85 76  Resp: 18  18   Temp: 99 F (37.2 C)  98.4 F (36.9 C) 99.4 F (37.4 C)  TempSrc: Oral  Oral Oral  SpO2: 96%  98% 95%  Weight:  (!) 159.2 kg (351 lb)    Height:        Intake/Output Summary (Last 24 hours) at 12/06/16 1414 Last data filed at 12/06/16 1356  Gross per 24 hour  Intake             1050 ml  Output             5150 ml  Net            -4100 ml   Filed Weights   12/03/16 0500 12/05/16 0402 12/06/16 0153  Weight: (!) 170.5 kg (375 lb 14.2 oz) (!) 160.5 kg (353 lb 14.4 oz) (!) 159.2 kg (351 lb)    General appearance: alert, distracted and no distress Resp: clear to auscultation bilaterally Cardio: regular rate and rhythm, S1, S2 normal, no murmur, click, rub or gallop GI: Obese. Nontender. No masses or organomegaly. Bowel sounds are present. Extremities: Chronic lymphedema bilateral lower  extremities. Neurologic: Awake, alert, distracted. Confused. Asterixis is present. No focal neurological deficits.  Lab Results:  Data Reviewed: I have personally reviewed following labs and imaging studies  CBC:  Recent Labs Lab 12/02/16 1517 12/03/16 0526 12/04/16 0144  WBC 15.3* 9.7 9.3  NEUTROABS 9.6*  --   --   HGB 10.6* 10.1* 10.1*  HCT 29.9* 28.4* 28.0*  MCV 96.8 96.9 96.2  PLT 173 161 154    Basic Metabolic Panel:  Recent Labs Lab 12/02/16 1517 12/02/16 1842 12/03/16 0526 12/04/16 0144 12/05/16 0926 12/06/16 0611  NA 139  --  139 138 138 139  K 3.6  --  3.3* 3.1* 3.4* 3.7  CL 108  --  109 109 107 106  CO2 24  --  24 25 24 27   GLUCOSE 96  --  89 104* 91 88  BUN 14  --  13 11 8 8   CREATININE 1.18  --  1.00 1.07 1.14 1.20  CALCIUM 8.7*  --  8.4* 8.3* 8.6* 8.5*  MG  --  1.7 1.7 1.9  --   --   PHOS  --   --   --   --  3.0 2.9    GFR: Estimated Creatinine Clearance: 112.5 mL/min (by C-G formula based on SCr of 1.2 mg/dL).  Liver Function Tests:  Recent  Labs Lab 12/02/16 1517 12/03/16 0526 12/04/16 0144 12/05/16 0926 12/06/16 0611  AST 90* 82* 75*  --   --   ALT 37 34 31  --   --   ALKPHOS 154* 133* 124  --   --   BILITOT 6.5* 5.7* 5.0*  --   --   PROT 7.8 7.3 6.9  --   --   ALBUMIN 2.1* 1.9* 1.8* 2.1* 1.9*     Recent Labs Lab 12/02/16 1517  LIPASE 25    Recent Labs Lab 12/02/16 1519 12/03/16 0526 12/04/16 0144  AMMONIA 83* 106* 122*    Coagulation Profile:  Recent Labs Lab 12/02/16 1842  INR 2.04    Thyroid Function Tests: No results for input(s): TSH, T4TOTAL, FREET4, T3FREE, THYROIDAB in the last 72 hours.   Radiology Studies: No results found.   Medications:  Scheduled: . folic acid  1 mg Oral Daily  . furosemide  60 mg Intravenous Once  . furosemide  60 mg Intravenous Q12H  . lactulose  30 g Oral QID  . multivitamin with minerals  1 tablet Oral Daily  . pantoprazole  40 mg Oral BID  . [START ON 12/07/2016]  pneumococcal 23 valent vaccine  0.5 mL Intramuscular Tomorrow-1000  . rifaximin  550 mg Oral TID  . sertraline  100 mg Oral Daily  . sodium chloride flush  3 mL Intravenous Q12H  . thiamine  100 mg Oral Daily   Continuous:  PRN:  Assessment/Plan:  Principal Problem:   Anasarca Active Problems:   Edema of abdominal wall   Hepatic encephalopathy (HCC)   Hepatitis C   Schizoaffective disorder (HCC)   Alcohol abuse   Coagulopathy (HCC)   Scrotal edema    Anasarca, most likely secondary to liver disease and hypoalbuminemia Patient also has chronic lower extremity lymphedema. He has gained 40-50 pounds over the last few weeks.  He does have significant scrotal edema.  Patient initiated on IV Lasix. He is diuresing well. Monitor weights. Foley catheter placed for strict ins and outs. Fluid restriction.  Continue to elevate the legs and continue IV diuresis, check daily weight.  Scrotal edema and pain Scrotal ultrasound does not show any testicular torsion. Pain is most likely secondary to the swelling.  This is most likely due to his anasarca.   Hepatic encephalopathy Continues to be confused with elevated ammonia of 122, lactulose dose increased to target for 5 bowel movements per day. Increase the lactulose dose, continue rifaximin  History of alcohol abuse CIWA protocol discontinued, was unsure if patient sleepy because of Ativan are because of his encephalopathy. Continue Thiamine, multivitamins and folic acid.   Abnormal EKG EKG done in the emergency department was read by the computer as atrial fibrillation, though it appeared to be sinus rhythm. EKG will need to be repeated. Has not been done yet this morning. QT interval was noted to be prolonged. Magnesium was 1.7.  History of hepatitis C with hyperbilirubinemia and coagulopathy INR is 2. Monitor his liver function periodically.  Possible acute on chronic diastolic CHF Based on an echocardiogram done at [facility] earlier this month he does have normal systolic function. Lasix as discussed above. Ins and outs and daily weights. Monitor renal function while he is getting diuresed.  History of schizoaffective disorder Due to his altered mental status and prolonged QT we will hold the Seroquel for now.  Normocytic anemia Monitor hemoglobin closely. No evidence for any bleeding at this time.  Hypokalemia -  This is repleted with oral supplements.  DVT Prophylaxis: Patient is coagulopathic  , baseline INR is 2.0  Code Status: Full code  Family Communication: No family at bedside  Disposition Plan: Management as outlined above. Mobilize.    LOS: 4 days   [facility]MA[facility],Jeyren Danowski A  [facility] Pager [phone] 12/06/2016, 2:14 PM  If 7PM-7AM, please contact night-coverage at [email or web address], password [email or web address]

## 2016-12-06 NOTE — Clinical Social Work Note (Signed)
Clinical Social Work Assessment  Patient Details  Name: Lawrence Barajas MRN: [id number] Date of Birth: [birth year]  Date of referral:  12/06/16               Reason for consult:  Facility Placement                Permission sought to share information with:  Facility Sport and exercise psychologist, Family Supports Permission granted to share information::  Yes, Verbal Permission Granted  Name::        Agency::  SNFs  Relationship::     Contact Information:     Housing/Transportation Living arrangements for the past 2 months:  No permanent address Source of Information:  Patient Patient Interpreter Needed:  None Criminal Activity/Legal Involvement Pertinent to Current Situation/Hospitalization:  No - Comment as needed Significant Relationships:  None Lives with:  Self Do you feel safe going back to the place where you live?  No Need for family participation in patient care:  No (Coment)  Care giving concerns:  CSW received consult for possible SNF placement at time of discharge. CSW met with patient regarding PT recommendation of SNF placement at time of discharge. Patient was unable to tell CSW where he came from before the Hospital admission. He mentioned [place] and that he was from a facility. CSW spoke with patient's emergency contact listed, which was a man Johnna Acosta) who did not know the patient by name. When CSW explained that he was the number listed as emergency contact, he stated he was the Linch family designated contact, but that he called the patient "Little Dude". He requested to speak with the patient. CSW replied that CSW would give patient his phone number if he wanted to talk with the gentleman. He could not tell CSW where patient came from or if he had any other family. CSW told patient about the emergency contact and patient reported that he knew him from "drug rehab". He wanted phone number, so CSW provided it to patient. Patient expressed understanding of PT recommendation  and is agreeable to SNF placement at time of discharge. CSW unsure of patient's cognitive status. CSW to continue to follow and assist with discharge planning needs.   Social Worker assessment / plan:  CSW spoke with patient concerning possibility of rehab at [facility] before returning home.  Employment status:  Disabled (Comment on whether or not currently receiving Disability) Insurance information:  Medicaid In [place] PT Recommendations:  [place] / Referral to community resources:  [facility]  Patient/Family's Response to care:  Patient recognizes need for rehab before returning home and is agreeable to a SNF in [place]. Patient reported that he does received a Medicaid check and reports understanding that Snf will take the check minus $30.  Patient/Family's Understanding of and Emotional Response to Diagnosis, Current Treatment, and Prognosis:  Patient/family is realistic regarding therapy needs and expressed being hopeful for SNF placement. Patient expressed understanding of CSW role and discharge process. No questions/concerns about plan or treatment.    Emotional Assessment Appearance:  Appears stated age Attitude/Demeanor/Rapport:  Other (Appropriate but out of it) Affect (typically observed):  Accepting Orientation:  Oriented to Self Alcohol / Substance use:  Not Applicable Psych involvement (Current and /or in the community):  No (Comment)  Discharge Needs  Concerns to be addressed:  Care Coordination Readmission within the last 30 days:  No Current discharge risk:  Cognitively Impaired Barriers to Discharge:  Continued Medical Work up  Benard Halsted, LCSWA 12/06/2016, 5:28 PM

## 2016-12-07 LAB — RENAL FUNCTION PANEL
Albumin: 2 g/dL — ABNORMAL LOW (ref 3.5–5.0)
Anion gap: 3 — ABNORMAL LOW (ref 5–15)
BUN: 9 mg/dL (ref 6–20)
CO2: 29 mmol/L (ref 22–32)
CREATININE: 1.26 mg/dL — AB (ref 0.61–1.24)
Calcium: 8.5 mg/dL — ABNORMAL LOW (ref 8.9–10.3)
Chloride: 105 mmol/L (ref 101–111)
GFR calc non Af Amer: >60 mL/min (ref >60)
Glucose, Bld: 79 mg/dL (ref 65–99)
PHOSPHORUS: 3.4 mg/dL (ref 2.5–4.6)
POTASSIUM: 3.7 mmol/L (ref 3.5–5.1)
Sodium: 137 mmol/L (ref 135–145)

## 2016-12-07 NOTE — Progress Notes (Signed)
[facility] PROGRESS NOTE  Jefrey Raburn WJX:[id number] DOB: [birth year] DOA: 12/02/2016  PCP: Pcp Not In System   Subjective/Interval History: Awake and alert, reportedly 4 bowel movements yesterday, continue current regimen.  Brief History/Interval Summary: 55 y.o. male with a past medical history of hepatitis C, schizoaffective disorder, chronic lymphedema of the lower extremities who was hospitalized at [facility] earlier this month for hematemesis. He underwent upper endoscopy there which revealed a Mallory-Weiss tear. Patient also has a history of alcohol abuse. Presented with complaints of weight gain and scrotal edema, swelling and discomfort. Found to have anasarca and hepatic encephalopathy. Was hospitalized for further management.  Reason for Visit: Hepatic encephalopathy. Anasarca.  Consultants: None  Procedures: None  Antibiotics: None  ROS: Unable to do due to mental confusion.  Objective:  Vital Signs  Vitals:   12/06/16 1401 12/06/16 2058 12/07/16 0528 12/07/16 0532  BP: (!) 154/74 (!) 162/77  (!) 146/67  Pulse: 76 77  80  Resp:  19  18  Temp: 99.4 F (37.4 C) 99.2 F (37.3 C)  98.9 F (37.2 C)  TempSrc: Oral   Oral  SpO2: 95% 99%  100%  Weight:   (!) 144.2 kg (318 lb)   Height:        Intake/Output Summary (Last 24 hours) at 12/07/16 1331 Last data filed at 12/07/16 0919  Gross per 24 hour  Intake             1060 ml  Output             7250 ml  Net            -6190 ml   Filed Weights   12/05/16 0402 12/06/16 0153 12/07/16 0528  Weight: (!) 160.5 kg (353 lb 14.4 oz) (!) 159.2 kg (351 lb) (!) 144.2 kg (318 lb)    General appearance: alert, distracted and no distress Resp: clear to auscultation bilaterally Cardio: regular rate and rhythm, S1, S2 normal, no murmur, click, rub or gallop GI: Obese. Nontender. No masses or organomegaly. Bowel sounds are present. Extremities: Chronic lymphedema bilateral lower  extremities. Neurologic: Awake, alert, distracted. Confused. Asterixis is present. No focal neurological deficits.  Lab Results:  Data Reviewed: I have personally reviewed following labs and imaging studies  CBC:  Recent Labs Lab 12/02/16 1517 12/03/16 0526 12/04/16 0144  WBC 15.3* 9.7 9.3  NEUTROABS 9.6*  --   --   HGB 10.6* 10.1* 10.1*  HCT 29.9* 28.4* 28.0*  MCV 96.8 96.9 96.2  PLT 173 161 154    Basic Metabolic Panel:  Recent Labs Lab 12/02/16 1842 12/03/16 0526 12/04/16 0144 12/05/16 0926 12/06/16 0611 12/07/16 0603  NA  --  139 138 138 139 137  K  --  3.3* 3.1* 3.4* 3.7 3.7  CL  --  109 109 107 106 105  CO2  --  24 25 24 27 29   GLUCOSE  --  89 104* 91 88 79  BUN  --  13 11 8 8 9   CREATININE  --  1.00 1.07 1.14 1.20 1.26*  CALCIUM  --  8.4* 8.3* 8.6* 8.5* 8.5*  MG 1.7 1.7 1.9  --   --   --   PHOS  --   --   --  3.0 2.9 3.4    GFR: Estimated Creatinine Clearance: 101.6 mL/min (by C-G formula based on SCr of 1.26 mg/dL (H)).  Liver Function Tests:  Recent Labs Lab 12/02/16 1517 12/03/16 0526 12/04/16  0144 12/05/16 0926 12/06/16 0611 12/07/16 0603  AST 90* 82* 75*  --   --   --   ALT 37 34 31  --   --   --   ALKPHOS 154* 133* 124  --   --   --   BILITOT 6.5* 5.7* 5.0*  --   --   --   PROT 7.8 7.3 6.9  --   --   --   ALBUMIN 2.1* 1.9* 1.8* 2.1* 1.9* 2.0*     Recent Labs Lab 12/02/16 1517  LIPASE 25    Recent Labs Lab 12/02/16 1519 12/03/16 0526 12/04/16 0144  AMMONIA 83* 106* 122*    Coagulation Profile:  Recent Labs Lab 12/02/16 1842  INR 2.04    Thyroid Function Tests: No results for input(s): TSH, T4TOTAL, FREET4, T3FREE, THYROIDAB in the last 72 hours.   Radiology Studies: No results found.   Medications:  Scheduled: . folic acid  1 mg Oral Daily  . furosemide  60 mg Intravenous Once  . furosemide  60 mg Intravenous Q12H  . lactulose  40 g Oral QID  . multivitamin with minerals  1 tablet Oral Daily  .  pantoprazole  40 mg Oral BID  . pneumococcal 23 valent vaccine  0.5 mL Intramuscular Tomorrow-1000  . rifaximin  550 mg Oral TID  . sertraline  100 mg Oral Daily  . sodium chloride flush  3 mL Intravenous Q12H  . spironolactone  100 mg Oral Daily  . thiamine  100 mg Oral Daily   Continuous:  PRN:  Assessment/Plan:  Principal Problem:   Anasarca Active Problems:   Edema of abdominal wall   Hepatic encephalopathy (HCC)   Hepatitis C   Schizoaffective disorder (HCC)   Alcohol abuse   Coagulopathy (HCC)   Scrotal edema    Anasarca, most likely secondary to liver disease and hypoalbuminemia Patient also has chronic lower extremity lymphedema. He has gained 40-50 pounds over the last few weeks.  He does have significant scrotal edema.  Patient initiated on IV Lasix. He is diuresing well. Monitor weights. Foley catheter placed for strict ins and outs. Fluid restriction.  Continue to elevate the legs and continue IV diuresis, check daily weight. Continue current regimen, her chart patient at 18 L negative since admission, his weight is down 20 bs.  Scrotal edema and pain Scrotal ultrasound does not show any testicular torsion. Pain is most likely secondary to the swelling.  This is most likely due to his anasarca.   Hepatic encephalopathy Continues to be confused with elevated ammonia of 122, lactulose dose increased to target for 5 bowel movements per day. Increase the lactulose dose, continue rifaximin  History of alcohol abuse CIWA protocol discontinued, was unsure if patient sleepy because of Ativan are because of his encephalopathy. Continue Thiamine, multivitamins and folic acid.   Abnormal EKG EKG done in the emergency department was read by the computer as atrial fibrillation, though it appeared to be sinus rhythm. EKG will need to be repeated. Has not been done yet this morning. QT interval was noted to be prolonged. Magnesium was 1.7.  History of hepatitis C  with hyperbilirubinemia and coagulopathy INR is 2. Monitor his liver function periodically.  Possible acute on chronic diastolic CHF Based [facility] he does have normal systolic function. Lasix as discussed above. Ins and outs and daily weights. Monitor renal function while he is getting diuresed.  History of  schizoaffective disorder Due to his altered mental status and prolonged QT we will hold the Seroquel for now.  Normocytic anemia Monitor hemoglobin closely. No evidence for any bleeding at this time.  Hypokalemia -This is repleted with oral supplements.  DVT Prophylaxis: Patient is coagulopathic  , baseline INR is 2.0  Code Status: Full code  Family Communication: No family at bedside  Disposition Plan: Management as outlined above. Mobilize.    LOS: 5 days   [facility] KlebergELMAHI,Joelyn Lover A  [facility] Pager [phone] 12/07/2016, 1:31 PM  If 7PM-7AM, please contact night-coverage at [email or web address], password [email or web address]

## 2016-12-08 LAB — RENAL FUNCTION PANEL
Albumin: 1.9 g/dL — ABNORMAL LOW (ref 3.5–5.0)
Anion gap: 6 (ref 5–15)
BUN: 10 mg/dL (ref 6–20)
CHLORIDE: 104 mmol/L (ref 101–111)
CO2: 25 mmol/L (ref 22–32)
CREATININE: 1.1 mg/dL (ref 0.61–1.24)
Calcium: 8.3 mg/dL — ABNORMAL LOW (ref 8.9–10.3)
GFR calc Af Amer: >60 mL/min (ref >60)
GLUCOSE: 89 mg/dL (ref 65–99)
Phosphorus: 3.5 mg/dL (ref 2.5–4.6)
Potassium: 3.5 mmol/L (ref 3.5–5.1)
Sodium: 135 mmol/L (ref 135–145)

## 2016-12-08 LAB — MAGNESIUM: MAGNESIUM: 1.7 mg/dL (ref 1.7–2.4)

## 2016-12-08 NOTE — Progress Notes (Signed)
[facility] PROGRESS NOTE  Lawrence Barajas ZOX:[id number] DOB: 19-Aug-1961 DOA: 12/02/2016  PCP: Pcp Not In System   Subjective/Interval History: Appears more awake and alert, 5 bowel movements since yesterday, continue current dose of lactulose and rifaximin. Continue diuresis likely to a nursing home tomorrow or day after tomorrow  Brief History/Interval Summary: 55 y.o. male with a past medical history of hepatitis C, schizoaffective disorder, chronic lymphedema of the lower extremities who was hospitalized at [facility] earlier this month for hematemesis. He underwent upper endoscopy there which revealed a Mallory-Weiss tear. Patient also has a history of alcohol abuse. Presented with complaints of weight gain and scrotal edema, swelling and discomfort. Found to have anasarca and hepatic encephalopathy. Was hospitalized for further management.  Reason for Visit: Hepatic encephalopathy. Anasarca.  Consultants: None  Procedures: None  Antibiotics: None  ROS: Unable to do due to mental confusion.  Objective:  Vital Signs  Vitals:   12/07/16 1429 12/07/16 2107 12/08/16 0425 12/08/16 0426  BP: (!) 161/69 (!) 159/78  (!) 153/72  Pulse: 77 79  76  Resp: 16 18  17   Temp: 98.6 F (37 C) 100.2 F (37.9 C)  99.4 F (37.4 C)  TempSrc: Oral Oral  Oral  SpO2: 99% 94%  97%  Weight:   (!) 148.8 kg (328 lb)   Height:        Intake/Output Summary (Last 24 hours) at 12/08/16 1140 Last data filed at 12/08/16 0945  Gross per 24 hour  Intake             1900 ml  Output             4300 ml  Net            -2400 ml   Filed Weights   12/06/16 0153 12/07/16 0528 12/08/16 0425  Weight: (!) 159.2 kg (351 lb) (!) 144.2 kg (318 lb) (!) 148.8 kg (328 lb)    General appearance: alert, distracted and no distress Resp: clear to auscultation bilaterally Cardio: regular rate and rhythm, S1, S2 normal, no murmur, click, rub or gallop GI: Obese. Nontender. No masses or  organomegaly. Bowel sounds are present. Extremities: Chronic lymphedema bilateral lower extremities. Neurologic: Awake, alert, distracted. Confused. Asterixis is present. No focal neurological deficits.  Lab Results:  Data Reviewed: I have personally reviewed following labs and imaging studies  CBC:  Recent Labs Lab 12/02/16 1517 12/03/16 0526 12/04/16 0144  WBC 15.3* 9.7 9.3  NEUTROABS 9.6*  --   --   HGB 10.6* 10.1* 10.1*  HCT 29.9* 28.4* 28.0*  MCV 96.8 96.9 96.2  PLT 173 161 154    Basic Metabolic Panel:  Recent Labs Lab 12/02/16 1842 12/03/16 0526 12/04/16 0144 12/05/16 0926 12/06/16 0611 12/07/16 0603 12/08/16 0441  NA  --  139 138 138 139 137 135  K  --  3.3* 3.1* 3.4* 3.7 3.7 3.5  CL  --  109 109 107 106 105 104  CO2  --  24 25 24 27 29 25   GLUCOSE  --  89 104* 91 88 79 89  BUN  --  13 11 8 8 9 10   CREATININE  --  1.00 1.07 1.14 1.20 1.26* 1.10  CALCIUM  --  8.4* 8.3* 8.6* 8.5* 8.5* 8.3*  MG 1.7 1.7 1.9  --   --   --  1.7  PHOS  --   --   --  3.0 2.9 3.4 3.5    GFR:  Estimated Creatinine Clearance: 118.3 mL/min (by C-G formula based on SCr of 1.1 mg/dL).  Liver Function Tests:  Recent Labs Lab 12/02/16 1517 12/03/16 0526 12/04/16 0144 12/05/16 0926 12/06/16 0611 12/07/16 0603 12/08/16 0441  AST 90* 82* 75*  --   --   --   --   ALT 37 34 31  --   --   --   --   ALKPHOS 154* 133* 124  --   --   --   --   BILITOT 6.5* 5.7* 5.0*  --   --   --   --   PROT 7.8 7.3 6.9  --   --   --   --   ALBUMIN 2.1* 1.9* 1.8* 2.1* 1.9* 2.0* 1.9*     Recent Labs Lab 12/02/16 1517  LIPASE 25    Recent Labs Lab 12/02/16 1519 12/03/16 0526 12/04/16 0144  AMMONIA 83* 106* 122*    Coagulation Profile:  Recent Labs Lab 12/02/16 1842  INR 2.04    Thyroid Function Tests: No results for input(s): TSH, T4TOTAL, FREET4, T3FREE, THYROIDAB in the last 72 hours.   Radiology Studies: No results found.   Medications:  Scheduled: . folic acid  1 mg  Oral Daily  . furosemide  60 mg Intravenous Q12H  . lactulose  40 g Oral QID  . multivitamin with minerals  1 tablet Oral Daily  . pantoprazole  40 mg Oral BID  . pneumococcal 23 valent vaccine  0.5 mL Intramuscular Tomorrow-1000  . rifaximin  550 mg Oral TID  . sertraline  100 mg Oral Daily  . sodium chloride flush  3 mL Intravenous Q12H  . spironolactone  100 mg Oral Daily  . thiamine  100 mg Oral Daily   Continuous:  PRN:  Assessment/Plan:  Principal Problem:   Anasarca Active Problems:   Edema of abdominal wall   Hepatic encephalopathy (HCC)   Hepatitis C   Schizoaffective disorder (HCC)   Alcohol abuse   Coagulopathy (HCC)   Scrotal edema    Anasarca, most likely secondary to liver disease and hypoalbuminemia Patient also has chronic lower extremity lymphedema. He has gained 40-50 pounds over the last few weeks.  He does have significant scrotal edema.  Patient initiated on IV Lasix. He is diuresing well. Monitor weights. Foley catheter placed for strict ins and outs. Fluid restriction.  Continue to elevate legs and IV diuresis. Check standing daily weight.  Scrotal edema and pain Scrotal ultrasound does not show any testicular torsion. Pain is most likely secondary to the swelling.  This is most likely due to his anasarca. This is improving.  Hepatic encephalopathy Continues to be confused with elevated ammonia of 122, lactulose dose increased to target for 5 bowel movements per day. Increase the lactulose dose, continue rifaximin. BMs on target at 5 BMs per day, continue current dose of lactulose/rifaximin  History of alcohol abuse CIWA protocol discontinued, was unsure if patient sleepy because of Ativan are because of his encephalopathy. Continue Thiamine, multivitamins and folic acid.   Abnormal EKG EKG done in the emergency department was read by the computer as atrial fibrillation, though it appeared to be sinus rhythm. EKG will need to be repeated.  Has not been done yet this morning. QT interval was noted to be prolonged. Magnesium was 1.7.  History of hepatitis C with hyperbilirubinemia and coagulopathy INR is 2. Monitor his liver function periodically.  Possible acute on chronic diastolic CHF Based [facility] this month he does have normal systolic function. Lasix as discussed above. Ins and outs and daily weights. Monitor renal function while he is getting diuresed.  History of schizoaffective disorder Due to his altered mental status and prolonged QT we will hold the Seroquel for now.  Normocytic anemia Monitor hemoglobin closely. No evidence for any bleeding at this time.  Hypokalemia -This is repleted with oral supplements.  DVT Prophylaxis: Patient is coagulopathic  , baseline INR is 2.0  Code Status: Full code  Family Communication: No family at bedside  Disposition Plan: Management as outlined above. Mobilize.    LOS: 6 days   [facility],Ash Mcelwain A  [facility] Pager [phone] 12/08/2016, 11:40 AM  If 7PM-7AM, please contact night-coverage at [email or web address], password [email or web address]

## 2016-12-09 LAB — RENAL FUNCTION PANEL
ALBUMIN: 1.8 g/dL — AB (ref 3.5–5.0)
Anion gap: 7 (ref 5–15)
BUN: 11 mg/dL (ref 6–20)
CHLORIDE: 103 mmol/L (ref 101–111)
CO2: 26 mmol/L (ref 22–32)
Calcium: 8.1 mg/dL — ABNORMAL LOW (ref 8.9–10.3)
Creatinine, Ser: 1.16 mg/dL (ref 0.61–1.24)
Glucose, Bld: 75 mg/dL (ref 65–99)
PHOSPHORUS: 3.1 mg/dL (ref 2.5–4.6)
Potassium: 3.6 mmol/L (ref 3.5–5.1)
Sodium: 136 mmol/L (ref 135–145)

## 2016-12-09 NOTE — Progress Notes (Addendum)
[facility] PROGRESS NOTE  Lolly MustacheDarren Giambra ONG:[id number] DOB: 12/25/1960 DOA: 12/02/2016  PCP: Pcp Not In System   Subjective/Interval History: Lower extremities is started to have creases with aggressive diuresis. Intake is having good bowel movements. Weight is down to 308 pounds 308 pounds all the way down from 370 pounds on admission  Brief History/Interval Summary: 55 y.o. male with a past medical history of hepatitis C, schizoaffective disorder, chronic lymphedema of the lower extremities who was [facility] earlier this month for hematemesis. He underwent upper endoscopy there which revealed a Mallory-Weiss tear. Patient also has a history of alcohol abuse. Presented with complaints of weight gain and scrotal edema, swelling and discomfort. Found to have anasarca and hepatic encephalopathy. Was hospitalized for further management.  Reason for Visit: Hepatic encephalopathy. Anasarca.  Consultants: None  Procedures: None  Antibiotics: None  ROS: Unable to do due to mental confusion.  Objective:  Vital Signs  Vitals:   12/08/16 1417 12/08/16 2200 12/09/16 0500 12/09/16 0556  BP: (!) 163/73 (!) 141/61  (!) 146/86  Pulse: 72 75  78  Resp: 16 18  17   Temp: 98.2 F (36.8 C) 98.9 F (37.2 C)  98.4 F (36.9 C)  TempSrc: Oral Oral  Oral  SpO2: 100% 98%  100%  Weight:   (!) 154.7 kg (341 lb)   Height:        Intake/Output Summary (Last 24 hours) at 12/09/16 1053 Last data filed at 12/09/16 0600  Gross per 24 hour  Intake             1240 ml  Output             2200 ml  Net             -960 ml   Filed Weights   12/07/16 0528 12/08/16 0425 12/09/16 0500  Weight: (!) 144.2 kg (318 lb) (!) 148.8 kg (328 lb) (!) 154.7 kg (341 lb)    General appearance: alert, distracted and no distress Resp: clear to auscultation bilaterally Cardio: regular rate and rhythm, S1, S2 normal, no murmur, click, rub or gallop GI: Obese. Nontender. No  masses or organomegaly. Bowel sounds are present. Extremities: Chronic lymphedema bilateral lower extremities. Neurologic: Awake, alert, distracted. Confused. Asterixis is present. No focal neurological deficits.  Lab Results:  Data Reviewed: I have personally reviewed following labs and imaging studies  CBC:  Recent Labs Lab 12/02/16 1517 12/03/16 0526 12/04/16 0144  WBC 15.3* 9.7 9.3  NEUTROABS 9.6*  --   --   HGB 10.6* 10.1* 10.1*  HCT 29.9* 28.4* 28.0*  MCV 96.8 96.9 96.2  PLT 173 161 154    Basic Metabolic Panel:  Recent Labs Lab 12/02/16 1842 12/03/16 0526 12/04/16 0144 12/05/16 0926 12/06/16 0611 12/07/16 0603 12/08/16 0441 12/09/16 0409  NA  --  139 138 138 139 137 135 136  K  --  3.3* 3.1* 3.4* 3.7 3.7 3.5 3.6  CL  --  109 109 107 106 105 104 103  CO2  --  24 25 24 27 29 25 26   GLUCOSE  --  89 104* 91 88 79 89 75  BUN  --  13 11 8 8 9 10 11   CREATININE  --  1.00 1.07 1.14 1.20 1.26* 1.10 1.16  CALCIUM  --  8.4* 8.3* 8.6* 8.5* 8.5* 8.3* 8.1*  MG 1.7 1.7 1.9  --   --   --  1.7  --  PHOS  --   --   --  3.0 2.9 3.4 3.5 3.1    GFR: Estimated Creatinine Clearance: 114.6 mL/min (by C-G formula based on SCr of 1.16 mg/dL).  Liver Function Tests:  Recent Labs Lab 12/02/16 1517 12/03/16 0526 12/04/16 0144 12/05/16 0926 12/06/16 0611 12/07/16 0603 12/08/16 0441 12/09/16 0409  AST 90* 82* 75*  --   --   --   --   --   ALT 37 34 31  --   --   --   --   --   ALKPHOS 154* 133* 124  --   --   --   --   --   BILITOT 6.5* 5.7* 5.0*  --   --   --   --   --   PROT 7.8 7.3 6.9  --   --   --   --   --   ALBUMIN 2.1* 1.9* 1.8* 2.1* 1.9* 2.0* 1.9* 1.8*     Recent Labs Lab 12/02/16 1517  LIPASE 25    Recent Labs Lab 12/02/16 1519 12/03/16 0526 12/04/16 0144  AMMONIA 83* 106* 122*    Coagulation Profile:  Recent Labs Lab 12/02/16 1842  INR 2.04    Thyroid Function Tests: No results for input(s): TSH, T4TOTAL, FREET4, T3FREE, THYROIDAB in  the last 72 hours.   Radiology Studies: No results found.   Medications:  Scheduled: . folic acid  1 mg Oral Daily  . furosemide  60 mg Intravenous Q12H  . lactulose  40 g Oral QID  . multivitamin with minerals  1 tablet Oral Daily  . pantoprazole  40 mg Oral BID  . pneumococcal 23 valent vaccine  0.5 mL Intramuscular Tomorrow-1000  . rifaximin  550 mg Oral TID  . sertraline  100 mg Oral Daily  . sodium chloride flush  3 mL Intravenous Q12H  . spironolactone  100 mg Oral Daily  . thiamine  100 mg Oral Daily   Continuous:  PRN:  Assessment/Plan:  Principal Problem:   Anasarca Active Problems:   Edema of abdominal wall   Hepatic encephalopathy (HCC)   Hepatitis C   Schizoaffective disorder (HCC)   Alcohol abuse   Coagulopathy (HCC)   Scrotal edema    Anasarca, secondary to liver disease and hypoalbuminemia Patient also has chronic lower extremity lymphedema. He has gained 40-50 pounds over the last few weeks.  He does have significant scrotal edema.  Patient initiated on IV Lasix. He is diuresing well. Monitor weights. Foley catheter placed for strict ins and outs. Fluid restriction.  Continue to elevate legs and IV diuresis. Check standing daily weight.  Scrotal edema and pain Scrotal ultrasound does not show any testicular torsion. Pain is most likely secondary to the swelling.  This is most likely due to his anasarca. This is improving.  Hepatic encephalopathy Continues to be confused with elevated ammonia of 122, lactulose dose increased to target for 5 bowel movements per day. Now he is getting 4 bowel movements per day, encephalopathy is improved. Awake, alert and oriented 4  History of alcohol abuse CIWA protocol discontinued, was unsure if patient sleepy because of Ativan are because of his encephalopathy. Continue Thiamine, multivitamins and folic acid.   Abnormal EKG EKG done in the emergency department was read by the computer as atrial  fibrillation, though it appeared to be sinus rhythm. EKG will need to be repeated. Has not been done yet this morning. QT interval was noted to be prolonged. Magnesium  was 1.7.  History of hepatitis C with hyperbilirubinemia and coagulopathy INR is 2. Monitor his liver function periodically.  Possible acute on chronic diastolic CHF Based on an echocardiogram done at [facility] earlier this month he does have normal systolic function. Lasix as discussed above. Ins and outs and daily weights. Monitor renal function while he is getting diuresed.  History of schizoaffective disorder Due to his altered mental status and prolonged QT we will hold the Seroquel for now.  Normocytic anemia Monitor hemoglobin closely. No evidence for any bleeding at this time.  Hypokalemia -This is repleted with oral supplements.  DVT Prophylaxis: Patient is coagulopathic  , baseline INR is 2.0, check INR in a.m.  Code Status: Full code  Family Communication: No family at bedside  Disposition Plan: To SNF in a.m.    LOS: 7 days   [facility] A  [facility] Pager [phone] 12/09/2016, 10:53 AM  If 7PM-7AM, please contact night-coverage at [email or web address], password [facility]

## 2016-12-10 ENCOUNTER — Encounter (HOSPITAL_COMMUNITY): Payer: Self-pay

## 2016-12-10 MED ORDER — RIFAXIMIN 550 MG PO TABS: 550.0000 mg | ORAL_TABLET | Freq: Three times a day (TID) | ORAL | Status: DC

## 2016-12-10 MED ORDER — SPIRONOLACTONE 100 MG PO TABS: 100.0000 mg | ORAL_TABLET | Freq: Every day | ORAL | Status: DC

## 2016-12-10 MED ORDER — LACTULOSE 10 GM/15ML PO SOLN: 30.0000 g | Freq: Three times a day (TID) | ORAL | 0 refills | Status: DC

## 2016-12-10 NOTE — Progress Notes (Signed)
[facility] Anticipated DC date: 12/10/16 Family notified: Patient stated he has no family Transport by: Sharin MonsPTAR   Per MD [facility]. RN, patient, and facility notified of DC. Discharge Summary sent to facility. RN given number for report. DC packet on chart. Ambulance transport requested for patient.   CSW signing off.  Cristobal GoldmannNadia Artha Chiasson, ConnecticutLCSWA Clinical Social Worker [phone]

## 2016-12-10 NOTE — Clinical Social Work Placement (Signed)
   CLINICAL SOCIAL WORK PLACEMENT  NOTE  Date:  12/10/2016  Patient Details  Name: Lawrence Barajas MRN: [id number] Date of Birth: 09/18/1961  Clinical Social Work is seeking post-discharge placement for this patient at the Skilled  Nursing Facility level of care (*CSW will initial, date and re-position this form in  chart as items are completed):  Yes   Patient/family provided with [facility] Clinical Social Work Department's list of facilities offering this level of care within the geographic area requested by the patient (or if unable, by the patient's family).  Yes   Patient/family informed of their freedom to choose among providers that offer the needed level of care, that participate in Medicare, Medicaid or managed care program needed by the patient, have an available bed and are willing to accept the patient.  Yes   Patient/family informed of [facility]'s ownership interest in [facility] and [facility], as well as of the fact that they are under no obligation to receive care at these facilities.  PASRR submitted to EDS on 12/06/16     PASRR number received on 12/06/16     Existing PASRR number confirmed on       FL2 transmitted to all facilities in geographic area requested by pt/family on 12/06/16     FL2 transmitted to all facilities within larger geographic area on       Patient informed that his/her managed care company has contracts with or will negotiate with certain facilities, including the following:        Yes   Patient/family informed of bed offers received.  [name]     Physician recommends and patient chooses bed at      [name]  Patient to be transferred to facility by [facility]     Patient family notified on 12/10/16 of transfer.  Name of family member notified:  N/A     PHYSICIAN Please sign FL2     Additional Comment:     _______________________________________________ Mearl LatinNadia S Tyaire Odem, LCSWA 12/10/2016, 2:11 PM

## 2016-12-10 NOTE — Discharge Summary (Signed)
Physician Discharge Summary  Lawrence Barajas BJY:[id number] DOB: [birth year] DOA: 12/02/2016  PCP: Pcp Not In System  Admit date: 12/02/2016 Discharge date: 12/10/2016  Admitted From: Home Disposition: SNF  Recommendations for Outpatient Follow-up:  1. Follow up with PCP in 1-2 weeks 2. Please obtain BMP/CBC in one week 3. Lactulose, Xifaxan and Aldactone added during this admission, follow renal function and potassium closely.  Home Health: NA Equipment/Devices:NA  Discharge Condition: Stable CODE STATUS: Full Code Diet recommendation: Diet Heart Room service appropriate? Yes; Fluid consistency: Thin; Fluid restriction: 1200 mL Fluid Diet - low sodium heart healthy  Brief/Interim Summary: 55 y.o.malewith a past medical history of hepatitis C, schizoaffective disorder, chronic lymphedema of the lower extremities who was hospitalized at [facility] earlier this month for hematemesis. He underwent upper endoscopy there which revealed a Mallory-Weiss tear. Patient also has a history of alcohol abuse. Presented with complaints of weight gain and scrotal edema, swelling and discomfort. Found to have anasarca and hepatic encephalopathy. Was hospitalized for further management.   Discharge Diagnoses:  Principal Problem:   Anasarca Active Problems:   Edema of abdominal wall   Hepatic encephalopathy (HCC)   Hepatitis C   Schizoaffective disorder (HCC)   Alcohol abuse   Coagulopathy (HCC)   Scrotal edema   Anasarca, secondary to liver disease and hypoalbuminemia Patient also has chronic lower extremity lymphedema. He has gained 40-50 pounds over the last few weeks.  He does have significant scrotal edema, which is improved significantly after initiation of diuresis.  Patient diuresed aggressively with IV Lasix, patient lost 62 pounds (weight down from 370 to 308)prior to discharge. Discharged on Lasix 40 mg twice a day and Aldactone 100 mg daily.  Scrotal edema and  pain Scrotal ultrasound does not show any testicular torsion. Pain is most likely secondary to the swelling.  This is most likely due to his anasarca. His improved with diuresis.  Hepatic encephalopathy Continues to be confused with elevated ammonia of 122, lactulose dose increased to target for 5 bowel movements per day. Continue lactulose and Xifaxan, target is 3-4 bowel movements per day.  History of alcohol abuse Did not have any symptoms of withdrawal while he was in the hospital. Continue Thiamine, multivitamins and folic acid.   Abnormal EKG EKG done in the emergency department was read by the computer as atrial fibrillation, though it appeared to be sinus rhythm. EKG will need to be repeated.  QT interval noted to be prolonged, magnesium at 1.7, this is resolved.  History of hepatitis C with hyperbilirubinemia and coagulopathy Patient is coagulopathic at baseline, INR is 2.  Possible acute on chronic diastolic CHF Based on an echocardiogram done at [facility] earlier this month he does have normal systolic function. Lasix as discussed above. Ins and outs and daily weights. Monitor renal function while he is getting diuresed.  History of schizoaffective disorder Due to his altered mental status and prolonged QT we will hold the Seroquel for now.  Normocytic anemia Monitor hemoglobin closely. No evidence for any bleeding at this time.  Hypokalemia -This is repleted with oral supplements.  Discharge Instructions  Discharge Instructions    Diet - low sodium heart healthy    Complete by:  As directed    Increase activity slowly    Complete by:  As directed      Allergies as of 12/10/2016   No Known Allergies     Medication List    TAKE these medications   Cetirizine  HCl 10 MG Caps Commonly known as:  ZYRTEC ALLERGY Take 1 capsule (10 mg total) by mouth at bedtime as needed (ear pain).   FLUoxetine 10 MG capsule Commonly known as:   PROZAC Take 10 mg by mouth daily.   fluticasone 50 MCG/ACT nasal spray Commonly known as:  FLONASE Place 1 spray into both nostrils daily.   folic acid 1 MG tablet Commonly known as:  FOLVITE Take 1 mg by mouth daily.   furosemide 40 MG tablet Commonly known as:  LASIX Take 40 mg by mouth 2 (two) times daily.   lactulose 10 GM/15ML solution Commonly known as:  CHRONULAC Take 45 mLs (30 g total) by mouth 3 (three) times daily.   MULTI-VITAMINS Tabs Take 1 tablet by mouth daily.   pantoprazole 40 MG tablet Commonly known as:  PROTONIX Take 40 mg by mouth 2 (two) times daily.   potassium chloride 20 MEQ packet Commonly known as:  KLOR-CON Take 20 mEq by mouth daily.   QUEtiapine 200 MG tablet Commonly known as:  SEROQUEL Take 200 mg by mouth at bedtime.   rifaximin 550 MG Tabs tablet Commonly known as:  XIFAXAN Take 1 tablet (550 mg total) by mouth 3 (three) times daily.   risperiDONE 2 MG tablet Commonly known as:  RISPERDAL Take 2 mg by mouth 2 (two) times daily.   spironolactone 100 MG tablet Commonly known as:  ALDACTONE Take 1 tablet (100 mg total) by mouth daily.   thiamine 100 MG tablet Take 100 mg by mouth daily.   traMADol 50 MG tablet Commonly known as:  ULTRAM Take 1 tablet (50 mg total) by mouth every 6 (six) hours as needed.   traZODone 50 MG tablet Commonly known as:  DESYREL Take 50 mg by mouth at bedtime.       No Known Allergies  Consultations:  None   Procedures (Echo, Carotid, EGD, Colonoscopy, ERCP)   Radiological studies: Dg Chest 2 View  Result Date: 12/02/2016 CLINICAL DATA:  Abdominal pain, history of pancreatitis, chronic alcohol abuse with recent drinking. EXAM: CHEST  2 VIEW COMPARISON:  None in PACs FINDINGS: The lungs are adequately inflated. The interstitial markings are coarse. The cardiac silhouette is enlarged and the pulmonary vascularity is engorged. There is no discrete infiltrate. There is no pleural effusion  or pneumothorax. The mediastinum is normal in width. The observed bony thorax exhibits no acute abnormality. IMPRESSION: Cardiomegaly with pulmonary vascular congestion and interstitial edema consistent with mild CHF. No alveolar pneumonia. Electronically Signed   By: David  [place] M.D.   On: 12/02/2016 15:44   US Scrotum  Result Date: 12/02/2016 CLINICAL DATA:  Bilateral scrotal pain and swelling for several days EXAM: SCROTAL ULTRASOUND DOPPLER ULTRASOUND OF THE TESTICLES TECHNIQUE: Complete ultrasound examination of the testicles, epididymis, and other scrotal structures was performed. Color and spectral Doppler ultrasound were also utilized to evaluate blood flow to the testicles. COMPARISON:  None. FINDINGS: Right testicle Measurements: 4.4 x 2.6 x 2.3 cm. No mass or microlithiasis visualized. Left testicle Measurements: 3.5 x 2.0 x 3.1 cm. No mass or microlithiasis visualized. Right epididymis:  Not visible Left epididymis:  Not visible Hydrocele:  None Varicocele:  None visible. Pulsed Doppler interrogation of both testes demonstrates normal low resistance arterial and venous waveforms bilaterally. There is scrotal skin thickening/edema bilaterally. IMPRESSION: Normal testes. No testicular mass or torsion. No abnormal scrotal fluid collections. Extensive scrotal skin thickening/edema. Electronically Signed   By: Ellery Plunk M.D.   On: 12/02/2016 21:19  [place] Art/ven Flow Abd Pelv Doppler  Result Date: 12/02/2016 CLINICAL DATA:  Bilateral scrotal pain and swelling for several days EXAM: SCROTAL ULTRASOUND DOPPLER ULTRASOUND OF THE TESTICLES TECHNIQUE: Complete ultrasound examination of the testicles, epididymis, and other scrotal structures was performed. Color and spectral Doppler ultrasound were also utilized to evaluate blood flow to the testicles. COMPARISON:  None. FINDINGS: Right testicle Measurements: 4.4 x 2.6 x 2.3 cm. No mass or microlithiasis visualized. Left testicle Measurements: 3.5  x 2.0 x 3.1 cm. No mass or microlithiasis visualized. Right epididymis:  Not visible Left epididymis:  Not visible Hydrocele:  None Varicocele:  None visible. Pulsed Doppler interrogation of both testes demonstrates normal low resistance arterial and venous waveforms bilaterally. There is scrotal skin thickening/edema bilaterally. IMPRESSION: Normal testes. No testicular mass or torsion. No abnormal scrotal fluid collections. Extensive scrotal skin thickening/edema. Electronically Signed   By: Ellery Plunkaniel R Mitchell M.D.   On: 12/02/2016 21:19    Subjective:  Discharge Exam: Vitals:   12/09/16 1106 12/09/16 1635 12/09/16 2232 12/10/16 0628  BP:  (!) 142/57 (!) 147/70 (!) 143/53  Pulse:  68 70 70  Resp:  18 17 17   Temp:  99.3 F (37.4 C) 99.2 F (37.3 C) 99 F (37.2 C)  TempSrc:  Oral Oral Oral  SpO2:  98% 99% 97%  Weight: (!) 139.8 kg (308 lb 1.6 oz)     Height:       General: Pt is alert, awake, not in acute distress Cardiovascular: RRR, S1/S2 +, no rubs, no gallops Respiratory: CTA bilaterally, no wheezing, no rhonchi Abdominal: Soft, NT, ND, bowel sounds + Extremities: no edema, no cyanosis   The results of significant diagnostics from this hospitalization (including imaging, microbiology, ancillary and laboratory) are listed below for reference.    Microbiology: No results found for this or any previous visit (from the past 240 hour(s)).   Labs: BNP (last 3 results)  Recent Labs  12/02/16 1518  BNP 291.2*   Basic Metabolic Panel:  Recent Labs Lab 12/04/16 0144 12/05/16 0926 12/06/16 0611 12/07/16 0603 12/08/16 0441 12/09/16 0409  NA 138 138 139 137 135 136  K 3.1* 3.4* 3.7 3.7 3.5 3.6  CL 109 107 106 105 104 103  CO2 25 24 27 29 25 26   GLUCOSE 104* 91 88 79 89 75  BUN 11 8 8 9 10 11   CREATININE 1.07 1.14 1.20 1.26* 1.10 1.16  CALCIUM 8.3* 8.6* 8.5* 8.5* 8.3* 8.1*  MG 1.9  --   --   --  1.7  --   PHOS  --  3.0 2.9 3.4 3.5 3.1   Liver Function  Tests:  Recent Labs Lab 12/04/16 0144 12/05/16 0926 12/06/16 0611 12/07/16 0603 12/08/16 0441 12/09/16 0409  AST 75*  --   --   --   --   --   ALT 31  --   --   --   --   --   ALKPHOS 124  --   --   --   --   --   BILITOT 5.0*  --   --   --   --   --   PROT 6.9  --   --   --   --   --   ALBUMIN 1.8* 2.1* 1.9* 2.0* 1.9* 1.8*   No results for input(s): LIPASE, AMYLASE in the last 168 hours.  Recent Labs Lab 12/04/16 0144  AMMONIA 122*   CBC:  Recent Labs Lab 12/04/16 0144  WBC 9.3  HGB 10.1*  HCT 28.0*  MCV 96.2  PLT 154   Cardiac Enzymes: No results for input(s): CKTOTAL, CKMB, CKMBINDEX, TROPONINI in the last 168 hours. BNP: Invalid input(s): POCBNP CBG: No results for input(s): GLUCAP in the last 168 hours. D-Dimer No results for input(s): DDIMER in the last 72 hours. Hgb A1c No results for input(s): HGBA1C in the last 72 hours. Lipid Profile No results for input(s): CHOL, HDL, LDLCALC, TRIG, CHOLHDL, LDLDIRECT in the last 72 hours. Thyroid function studies No results for input(s): TSH, T4TOTAL, T3FREE, THYROIDAB in the last 72 hours.  Invalid input(s): FREET3 Anemia work up No results for input(s): VITAMINB12, FOLATE, FERRITIN, TIBC, IRON, RETICCTPCT in the last 72 hours. Urinalysis    Component Value Date/Time   COLORURINE YELLOW 12/04/2016 0554   APPEARANCEUR CLEAR 12/04/2016 0554   LABSPEC 1.011 12/04/2016 0554   PHURINE 7.0 12/04/2016 0554   GLUCOSEU NEGATIVE 12/04/2016 0554   HGBUR SMALL (A) 12/04/2016 0554   BILIRUBINUR NEGATIVE 12/04/2016 0554   KETONESUR NEGATIVE 12/04/2016 0554   PROTEINUR NEGATIVE 12/04/2016 0554   NITRITE NEGATIVE 12/04/2016 0554   LEUKOCYTESUR NEGATIVE 12/04/2016 0554   Sepsis Labs Invalid input(s): PROCALCITONIN,  WBC,  LACTICIDVEN Microbiology No results found for this or any previous visit (from the past 240 hour(s)).   Time coordinating discharge: Over 30 minutes  SIGNED:   Clint LippsELMAHI,Christain Niznik A, MD  [facility] 12/10/2016, 10:49 AM Pager   If 7PM-7AM, please contact night-coverage [email or web address] Password [email or web address]

## 2016-12-10 NOTE — Progress Notes (Signed)
Called report to nurse [facility]. Reviewed with nurse patient's PMH, current hospital course, recent vitals, lab work, medications, and weight. Patient to discharge via BLS on the next available transport, will continue to monitor until time of discharge.

## 2016-12-10 NOTE — Care Management Note (Signed)
Case Management Note  Patient Details  Name: [name] MRN: [id number] Date of Birth: 03/10/1961  Subjective/Objective:                    Action/Plan:  Anticipate DC to SNF today as facilitated by CSW.   Expected Discharge Date:                  Expected Discharge Plan:  Skilled Nursing Facility  In-House Referral:  Clinical Social Work  Discharge planning Services  CM Consult  Post Acute Care Choice:    Choice offered to:     DME Arranged:    DME Agency:     HH Arranged:    HH Agency:     Status of Service:  Completed, signed off  If discussed at [employer] Meetings, dates discussed:    Additional Comments:  Lawerance SabalDebbie Madge Therrien, RN 12/10/2016, 10:42 AM

## 2017-01-07 DIAGNOSIS — A419 Sepsis, unspecified organism: Secondary | ICD-10-CM

## 2017-01-07 DIAGNOSIS — K7290 Hepatic failure, unspecified without coma: Secondary | ICD-10-CM

## 2017-01-07 DIAGNOSIS — N390 Urinary tract infection, site not specified: Secondary | ICD-10-CM

## 2017-01-07 DIAGNOSIS — N289 Disorder of kidney and ureter, unspecified: Secondary | ICD-10-CM

## 2017-01-08 DIAGNOSIS — N390 Urinary tract infection, site not specified: Secondary | ICD-10-CM | POA: Diagnosis not present

## 2017-01-08 DIAGNOSIS — A419 Sepsis, unspecified organism: Secondary | ICD-10-CM | POA: Diagnosis not present

## 2017-01-08 DIAGNOSIS — K7290 Hepatic failure, unspecified without coma: Secondary | ICD-10-CM | POA: Diagnosis not present

## 2017-01-08 DIAGNOSIS — N289 Disorder of kidney and ureter, unspecified: Secondary | ICD-10-CM | POA: Diagnosis not present

## 2017-01-09 DIAGNOSIS — A419 Sepsis, unspecified organism: Secondary | ICD-10-CM | POA: Diagnosis not present

## 2017-01-09 DIAGNOSIS — K7290 Hepatic failure, unspecified without coma: Secondary | ICD-10-CM | POA: Diagnosis not present

## 2017-01-09 DIAGNOSIS — N390 Urinary tract infection, site not specified: Secondary | ICD-10-CM | POA: Diagnosis not present

## 2017-01-09 DIAGNOSIS — E440 Moderate protein-calorie malnutrition: Secondary | ICD-10-CM

## 2017-01-09 DIAGNOSIS — N289 Disorder of kidney and ureter, unspecified: Secondary | ICD-10-CM | POA: Diagnosis not present

## 2017-01-10 DIAGNOSIS — N289 Disorder of kidney and ureter, unspecified: Secondary | ICD-10-CM | POA: Diagnosis not present

## 2017-01-10 DIAGNOSIS — K7290 Hepatic failure, unspecified without coma: Secondary | ICD-10-CM | POA: Diagnosis not present

## 2017-01-10 DIAGNOSIS — N390 Urinary tract infection, site not specified: Secondary | ICD-10-CM | POA: Diagnosis not present

## 2017-01-10 DIAGNOSIS — A419 Sepsis, unspecified organism: Secondary | ICD-10-CM | POA: Diagnosis not present

## 2017-01-27 DIAGNOSIS — E440 Moderate protein-calorie malnutrition: Secondary | ICD-10-CM

## 2017-01-27 DIAGNOSIS — R451 Restlessness and agitation: Secondary | ICD-10-CM

## 2017-01-27 DIAGNOSIS — A419 Sepsis, unspecified organism: Secondary | ICD-10-CM

## 2017-01-27 DIAGNOSIS — N289 Disorder of kidney and ureter, unspecified: Secondary | ICD-10-CM

## 2017-01-27 DIAGNOSIS — N390 Urinary tract infection, site not specified: Secondary | ICD-10-CM

## 2017-01-27 DIAGNOSIS — E860 Dehydration: Secondary | ICD-10-CM | POA: Diagnosis not present

## 2017-01-27 DIAGNOSIS — D649 Anemia, unspecified: Secondary | ICD-10-CM | POA: Diagnosis not present

## 2017-01-27 DIAGNOSIS — K7290 Hepatic failure, unspecified without coma: Secondary | ICD-10-CM

## 2017-01-27 DIAGNOSIS — D72829 Elevated white blood cell count, unspecified: Secondary | ICD-10-CM

## 2017-01-28 DIAGNOSIS — K7290 Hepatic failure, unspecified without coma: Secondary | ICD-10-CM | POA: Diagnosis not present

## 2017-01-28 DIAGNOSIS — D649 Anemia, unspecified: Secondary | ICD-10-CM | POA: Diagnosis not present

## 2017-01-28 DIAGNOSIS — N289 Disorder of kidney and ureter, unspecified: Secondary | ICD-10-CM | POA: Diagnosis not present

## 2017-01-28 DIAGNOSIS — D72829 Elevated white blood cell count, unspecified: Secondary | ICD-10-CM | POA: Diagnosis not present

## 2017-02-13 DIAGNOSIS — A0472 Enterocolitis due to Clostridium difficile, not specified as recurrent: Secondary | ICD-10-CM | POA: Insufficient documentation

## 2017-02-13 DIAGNOSIS — K529 Noninfective gastroenteritis and colitis, unspecified: Secondary | ICD-10-CM | POA: Insufficient documentation

## 2017-02-13 DIAGNOSIS — D649 Anemia, unspecified: Secondary | ICD-10-CM | POA: Insufficient documentation

## 2017-02-14 DIAGNOSIS — I5032 Chronic diastolic (congestive) heart failure: Secondary | ICD-10-CM | POA: Insufficient documentation

## 2017-03-03 ENCOUNTER — Emergency Department: Payer: Medicaid Other

## 2017-03-03 ENCOUNTER — Encounter: Payer: Self-pay | Admitting: Emergency Medicine

## 2017-03-03 ENCOUNTER — Inpatient Hospital Stay
Admission: EM | Admit: 2017-03-03 | Discharge: 2017-03-05 | DRG: 602 | Disposition: A | Discharge status: Home / Self Care | Payer: Medicaid Other | Attending: Internal Medicine | Admitting: Internal Medicine

## 2017-03-03 DIAGNOSIS — Z9119 Patient's noncompliance with other medical treatment and regimen: Secondary | ICD-10-CM | POA: Diagnosis not present

## 2017-03-03 DIAGNOSIS — E8809 Other disorders of plasma-protein metabolism, not elsewhere classified: Secondary | ICD-10-CM | POA: Diagnosis present

## 2017-03-03 DIAGNOSIS — Z79899 Other long term (current) drug therapy: Secondary | ICD-10-CM | POA: Diagnosis not present

## 2017-03-03 DIAGNOSIS — K7290 Hepatic failure, unspecified without coma: Secondary | ICD-10-CM | POA: Diagnosis present

## 2017-03-03 DIAGNOSIS — F259 Schizoaffective disorder, unspecified: Secondary | ICD-10-CM | POA: Diagnosis present

## 2017-03-03 DIAGNOSIS — N5089 Other specified disorders of the male genital organs: Secondary | ICD-10-CM | POA: Diagnosis present

## 2017-03-03 DIAGNOSIS — T501X6A Underdosing of loop [high-ceiling] diuretics, initial encounter: Secondary | ICD-10-CM | POA: Diagnosis present

## 2017-03-03 DIAGNOSIS — Z8619 Personal history of other infectious and parasitic diseases: Secondary | ICD-10-CM

## 2017-03-03 DIAGNOSIS — L03115 Cellulitis of right lower limb: Secondary | ICD-10-CM | POA: Diagnosis present

## 2017-03-03 DIAGNOSIS — I110 Hypertensive heart disease with heart failure: Secondary | ICD-10-CM | POA: Diagnosis present

## 2017-03-03 DIAGNOSIS — I5033 Acute on chronic diastolic (congestive) heart failure: Secondary | ICD-10-CM | POA: Diagnosis present

## 2017-03-03 DIAGNOSIS — F1010 Alcohol abuse, uncomplicated: Secondary | ICD-10-CM | POA: Diagnosis present

## 2017-03-03 DIAGNOSIS — F17210 Nicotine dependence, cigarettes, uncomplicated: Secondary | ICD-10-CM | POA: Diagnosis present

## 2017-03-03 DIAGNOSIS — Z9114 Patient's other noncompliance with medication regimen: Secondary | ICD-10-CM

## 2017-03-03 DIAGNOSIS — A419 Sepsis, unspecified organism: Secondary | ICD-10-CM | POA: Diagnosis present

## 2017-03-03 DIAGNOSIS — Z9111 Patient's noncompliance with dietary regimen: Secondary | ICD-10-CM | POA: Diagnosis not present

## 2017-03-03 DIAGNOSIS — E876 Hypokalemia: Secondary | ICD-10-CM | POA: Diagnosis present

## 2017-03-03 DIAGNOSIS — D649 Anemia, unspecified: Secondary | ICD-10-CM | POA: Diagnosis present

## 2017-03-03 DIAGNOSIS — I890 Lymphedema, not elsewhere classified: Secondary | ICD-10-CM | POA: Diagnosis present

## 2017-03-03 LAB — CBC WITH DIFFERENTIAL/PLATELET
BASOS ABS: 0.1 10*3/uL (ref 0–0.1)
BASOS PCT: 0 %
Eosinophils Absolute: 0 10*3/uL (ref 0–0.7)
Eosinophils Relative: 0 %
HEMATOCRIT: 25.1 % — AB (ref 40.0–52.0)
HEMOGLOBIN: 8.9 g/dL — AB (ref 13.0–18.0)
Lymphocytes Relative: 10 %
Lymphs Abs: 1.9 10*3/uL (ref 1.0–3.6)
MCH: 37.3 pg — ABNORMAL HIGH (ref 26.0–34.0)
MCHC: 35.5 g/dL (ref 32.0–36.0)
MCV: 105.2 fL — ABNORMAL HIGH (ref 80.0–100.0)
Monocytes Absolute: 1.7 10*3/uL — ABNORMAL HIGH (ref 0.2–1.0)
Monocytes Relative: 9 %
NEUTROS ABS: 14.7 10*3/uL — AB (ref 1.4–6.5)
NEUTROS PCT: 81 %
Platelets: 136 10*3/uL — ABNORMAL LOW (ref 150–440)
RBC: 2.39 MIL/uL — ABNORMAL LOW (ref 4.40–5.90)
RDW: 18.1 % — ABNORMAL HIGH (ref 11.5–14.5)
WBC: 18.3 10*3/uL — ABNORMAL HIGH (ref 3.8–10.6)

## 2017-03-03 LAB — COMPREHENSIVE METABOLIC PANEL
ALK PHOS: 127 U/L — AB (ref 38–126)
ALT: 32 U/L (ref 17–63)
AST: 86 U/L — ABNORMAL HIGH (ref 15–41)
Albumin: 2.4 g/dL — ABNORMAL LOW (ref 3.5–5.0)
Anion gap: 6 (ref 5–15)
BILIRUBIN TOTAL: 4.8 mg/dL — AB (ref 0.3–1.2)
BUN: 30 mg/dL — ABNORMAL HIGH (ref 6–20)
CALCIUM: 8.1 mg/dL — AB (ref 8.9–10.3)
CO2: 18 mmol/L — AB (ref 22–32)
Chloride: 108 mmol/L (ref 101–111)
Creatinine, Ser: 1.4 mg/dL — ABNORMAL HIGH (ref 0.61–1.24)
GFR calc non Af Amer: 55 mL/min — ABNORMAL LOW (ref >60)
Glucose, Bld: 85 mg/dL (ref 65–99)
Potassium: 4 mmol/L (ref 3.5–5.1)
SODIUM: 132 mmol/L — AB (ref 135–145)
TOTAL PROTEIN: 7 g/dL (ref 6.5–8.1)

## 2017-03-03 LAB — LACTIC ACID, PLASMA
LACTIC ACID, VENOUS: 2.5 mmol/L — AB (ref 0.5–1.9)
Lactic Acid, Venous: 2.4 mmol/L (ref 0.5–1.9)

## 2017-03-03 LAB — TROPONIN I: Troponin I: 0.13 ng/mL (ref <0.03)

## 2017-03-03 LAB — AMMONIA: AMMONIA: 40 umol/L — AB (ref 9–35)

## 2017-03-03 LAB — BRAIN NATRIURETIC PEPTIDE: B Natriuretic Peptide: 1195 pg/mL — ABNORMAL HIGH (ref 0.0–100.0)

## 2017-03-03 MED ORDER — HEPARIN SODIUM (PORCINE) 5000 UNIT/ML IJ SOLN
5000.0000 [IU] | Freq: Three times a day (TID) | INTRAMUSCULAR | Status: DC
  Administered 2017-03-03 – 2017-03-04 (×2): 5000 [IU] via SUBCUTANEOUS
  Filled 2017-03-03 (×2): qty 1

## 2017-03-03 MED ORDER — LACTULOSE 10 GM/15ML PO SOLN
30.0000 g | Freq: Three times a day (TID) | ORAL | Status: DC
  Administered 2017-03-03 – 2017-03-05 (×5): 30 g via ORAL
  Filled 2017-03-03 (×5): qty 60

## 2017-03-03 MED ORDER — LORAZEPAM 2 MG/ML IJ SOLN: 1.0000 mg | Freq: Four times a day (QID) | INTRAMUSCULAR | Status: DC | PRN

## 2017-03-03 MED ORDER — SODIUM CHLORIDE 0.9 % IV SOLN
INTRAVENOUS | Status: DC
  Administered 2017-03-03: 18:00:00 via INTRAVENOUS

## 2017-03-03 MED ORDER — QUETIAPINE FUMARATE 100 MG PO TABS
200.0000 mg | ORAL_TABLET | Freq: Every day | ORAL | Status: DC
  Administered 2017-03-03 – 2017-03-04 (×2): 200 mg via ORAL
  Filled 2017-03-03 (×2): qty 2

## 2017-03-03 MED ORDER — RIFAXIMIN 550 MG PO TABS
550.0000 mg | ORAL_TABLET | Freq: Three times a day (TID) | ORAL | Status: DC
  Administered 2017-03-03: 550 mg via ORAL
  Filled 2017-03-03: qty 1

## 2017-03-03 MED ORDER — RISPERIDONE 1 MG PO TABS
2.0000 mg | ORAL_TABLET | Freq: Two times a day (BID) | ORAL | Status: DC
  Administered 2017-03-03 – 2017-03-04 (×2): 2 mg via ORAL
  Filled 2017-03-03 (×2): qty 2

## 2017-03-03 MED ORDER — DOCUSATE SODIUM 100 MG PO CAPS
100.0000 mg | ORAL_CAPSULE | Freq: Two times a day (BID) | ORAL | Status: DC
  Administered 2017-03-03 – 2017-03-05 (×3): 100 mg via ORAL
  Filled 2017-03-03 (×3): qty 1

## 2017-03-03 MED ORDER — SPIRONOLACTONE 25 MG PO TABS
100.0000 mg | ORAL_TABLET | Freq: Every day | ORAL | Status: DC
  Administered 2017-03-03 – 2017-03-05 (×3): 100 mg via ORAL
  Filled 2017-03-03 (×3): qty 4

## 2017-03-03 MED ORDER — PIPERACILLIN-TAZOBACTAM 3.375 G IVPB 30 MIN
3.3750 g | Freq: Once | INTRAVENOUS | Status: AC
  Administered 2017-03-03: 3.375 g via INTRAVENOUS
  Filled 2017-03-03: qty 50

## 2017-03-03 MED ORDER — ONDANSETRON HCL 4 MG PO TABS: 4.0000 mg | ORAL_TABLET | Freq: Four times a day (QID) | ORAL | Status: DC | PRN

## 2017-03-03 MED ORDER — SODIUM CHLORIDE 0.9 % IV SOLN
3.0000 g | Freq: Four times a day (QID) | INTRAVENOUS | Status: DC
  Administered 2017-03-03 – 2017-03-04 (×2): 3 g via INTRAVENOUS
  Filled 2017-03-03 (×5): qty 3

## 2017-03-03 MED ORDER — SODIUM CHLORIDE 0.9 % IV SOLN
Freq: Once | INTRAVENOUS | Status: AC
  Administered 2017-03-04: 04:00:00 via INTRAVENOUS

## 2017-03-03 MED ORDER — PANTOPRAZOLE SODIUM 40 MG PO TBEC
40.0000 mg | DELAYED_RELEASE_TABLET | Freq: Every day | ORAL | Status: DC
  Administered 2017-03-03 – 2017-03-05 (×3): 40 mg via ORAL
  Filled 2017-03-03 (×3): qty 1

## 2017-03-03 MED ORDER — LORAZEPAM 1 MG PO TABS: 1.0000 mg | ORAL_TABLET | Freq: Four times a day (QID) | ORAL | Status: DC | PRN

## 2017-03-03 MED ORDER — FUROSEMIDE 10 MG/ML IJ SOLN
40.0000 mg | Freq: Two times a day (BID) | INTRAMUSCULAR | Status: DC
  Administered 2017-03-03 – 2017-03-05 (×3): 40 mg via INTRAVENOUS
  Filled 2017-03-03 (×3): qty 4

## 2017-03-03 MED ORDER — ONDANSETRON HCL 4 MG/2ML IJ SOLN: 4.0000 mg | Freq: Four times a day (QID) | INTRAMUSCULAR | Status: DC | PRN

## 2017-03-03 MED ORDER — ADULT MULTIVITAMIN W/MINERALS CH
1.0000 | ORAL_TABLET | Freq: Every day | ORAL | Status: DC
  Administered 2017-03-04 – 2017-03-05 (×2): 1 via ORAL
  Filled 2017-03-03 (×2): qty 1

## 2017-03-03 MED ORDER — LORAZEPAM 2 MG/ML IJ SOLN
0.0000 mg | Freq: Four times a day (QID) | INTRAMUSCULAR | Status: AC
Start: 2017-03-03 — End: 2017-03-05

## 2017-03-03 MED ORDER — ASPIRIN 81 MG PO CHEW
324.0000 mg | CHEWABLE_TABLET | Freq: Once | ORAL | Status: AC
  Administered 2017-03-03: 324 mg via ORAL
  Filled 2017-03-03: qty 4

## 2017-03-03 MED ORDER — NICOTINE 21 MG/24HR TD PT24
21.0000 mg | MEDICATED_PATCH | Freq: Every day | TRANSDERMAL | Status: DC
  Administered 2017-03-03 – 2017-03-05 (×3): 21 mg via TRANSDERMAL
  Filled 2017-03-03 (×3): qty 1

## 2017-03-03 MED ORDER — FOLIC ACID 1 MG PO TABS: 1.0000 mg | ORAL_TABLET | Freq: Every day | ORAL | Status: DC

## 2017-03-03 MED ORDER — VANCOMYCIN HCL IN DEXTROSE 1-5 GM/200ML-% IV SOLN
1000.0000 mg | Freq: Once | INTRAVENOUS | Status: AC
  Administered 2017-03-03: 1000 mg via INTRAVENOUS
  Filled 2017-03-03: qty 200

## 2017-03-03 MED ORDER — TRAMADOL HCL 50 MG PO TABS
50.0000 mg | ORAL_TABLET | Freq: Four times a day (QID) | ORAL | Status: DC | PRN
  Administered 2017-03-04 – 2017-03-05 (×2): 50 mg via ORAL
  Filled 2017-03-03 (×2): qty 1

## 2017-03-03 MED ORDER — MULTI-VITAMINS PO TABS: 1.0000 | ORAL_TABLET | Freq: Every day | ORAL | Status: DC

## 2017-03-03 MED ORDER — LORAZEPAM 2 MG/ML IJ SOLN
0.0000 mg | Freq: Two times a day (BID) | INTRAMUSCULAR | Status: DC
Start: 2017-03-05 — End: 2017-03-05

## 2017-03-03 MED ORDER — FOLIC ACID 1 MG PO TABS
1.0000 mg | ORAL_TABLET | Freq: Every day | ORAL | Status: DC
Start: 2017-03-03 — End: 2017-03-05
  Administered 2017-03-04 – 2017-03-05 (×2): 1 mg via ORAL
  Filled 2017-03-03 (×2): qty 1

## 2017-03-03 MED ORDER — SODIUM CHLORIDE 0.9 % IV SOLN
3.0000 g | Freq: Four times a day (QID) | INTRAVENOUS | Status: DC
  Filled 2017-03-03 (×2): qty 3

## 2017-03-03 MED ORDER — SODIUM CHLORIDE 0.9 % IV SOLN: 3.0000 g | Freq: Four times a day (QID) | INTRAVENOUS | Status: DC

## 2017-03-03 MED ORDER — ALBUTEROL SULFATE (2.5 MG/3ML) 0.083% IN NEBU: 2.5000 mg | INHALATION_SOLUTION | Freq: Four times a day (QID) | RESPIRATORY_TRACT | Status: DC | PRN

## 2017-03-03 MED ORDER — THIAMINE HCL 100 MG/ML IJ SOLN
100.0000 mg | Freq: Every day | INTRAMUSCULAR | Status: DC
  Filled 2017-03-03: qty 1

## 2017-03-03 MED ORDER — VITAMIN B-1 100 MG PO TABS
100.0000 mg | ORAL_TABLET | Freq: Every day | ORAL | Status: DC
  Administered 2017-03-03 – 2017-03-05 (×3): 100 mg via ORAL
  Filled 2017-03-03 (×3): qty 1

## 2017-03-03 MED ORDER — THIAMINE HCL 100 MG PO TABS: 100.0000 mg | ORAL_TABLET | Freq: Every day | ORAL | Status: DC

## 2017-03-03 NOTE — Progress Notes (Signed)
Lab notified this RN of a critical lab value  Lactic acid 2.4, which is lower than pervious value. MD notified. No new orders.

## 2017-03-03 NOTE — Consult Note (Signed)
Pharmacy Antibiotic Note  Lawrence Barajas is a 56 y.o. male admitted on 03/03/2017 with cellulitis.  Pharmacy has been consulted for unasyn dosing. Pt received 1 dose of vanc and zosyn in the ED  Plan: unasyn 3g q 6 hours  Height: 6\' 3"  (190.5 cm) Weight: (!) 368 lb (166.9 kg) IBW/kg (Calculated) : 84.5  Temp (24hrs), Avg:100.9 F (38.3 C), Min:100 F (37.8 C), Max:101.8 F (38.8 C)   Recent Labs Lab 03/03/17 1326  WBC 18.3*  CREATININE 1.40*  LATICACIDVEN 2.5*    Estimated Creatinine Clearance: 97.9 mL/min (A) (by C-G formula based on SCr of 1.4 mg/dL (H)).    No Known Allergies  Antimicrobials this admission: zosyn 3/19 >> one dose vancomycin 3/19 >> one dose unasyn 3/19>>  Dose adjustments this admission:   Microbiology results: 3/19 BCx: only was able to collect one set 3/19 UCx:     Thank you for allowing pharmacy to be a part of this patient's care.  Olene FlossMelissa D Maccia, Pharm.D, BCPS Clinical Pharmacist  03/03/2017 3:38 PM

## 2017-03-03 NOTE — ED Notes (Signed)
Multiple attempts for IV access buy this nurse and Mardelle MatteAndy, [employer] unsuccessful.  MD notified, ultrasound machine placed at bedside.

## 2017-03-03 NOTE — ED Notes (Signed)
Called code sepsis [id number]
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
## 2017-03-03 NOTE — Progress Notes (Signed)
Patient is A&O x4.  Asking to get food and soda before this RN had a change to introduce myself. Also requiring how long it would take to get his meds.  Tele applied. Bilat 4+ pitting edema to LE. Ambulates with assist x1. Incont of stool. IV fludis started and reviewed POC. Bed alarm on for safety.

## 2017-03-03 NOTE — ED Triage Notes (Signed)
Pt to ED from friend's apartment c/o bilateral leg swelling for months per patient.  Patient recently released from nursing home due to homelessness.  Patient reports advised to stop taking lasix 2 weeks ago d/t affecting kidneys.  Pt with +4 pitting edema to bilateral legs, denies SOB, states has had diarrhea x2 days, denies fevers at home but presents with fever 101.8.  Pt A&Ox4, chest rise even and unlabored, speaking complete and coherent sentences.

## 2017-03-03 NOTE — ED Notes (Signed)
Patient difficulty stick, unable to redraw second lactic.  Lab notified patient's new location.
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
## 2017-03-03 NOTE — ED Notes (Signed)
Unable to obtain second set of blood cultures.  MD aware, to continue hanging antibiotics.

## 2017-03-04 LAB — BASIC METABOLIC PANEL
Anion gap: 4 — ABNORMAL LOW (ref 5–15)
BUN: 32 mg/dL — ABNORMAL HIGH (ref 6–20)
CO2: 19 mmol/L — ABNORMAL LOW (ref 22–32)
CREATININE: 1.32 mg/dL — AB (ref 0.61–1.24)
Calcium: 7.7 mg/dL — ABNORMAL LOW (ref 8.9–10.3)
Chloride: 112 mmol/L — ABNORMAL HIGH (ref 101–111)
GFR, EST NON AFRICAN AMERICAN: 59 mL/min — AB (ref >60)
Glucose, Bld: 76 mg/dL (ref 65–99)
Potassium: 3.9 mmol/L (ref 3.5–5.1)
SODIUM: 135 mmol/L (ref 135–145)

## 2017-03-04 LAB — PROTIME-INR
INR: 2.3
PROTHROMBIN TIME: 25.7 s — AB (ref 11.4–15.2)

## 2017-03-04 MED ORDER — ENOXAPARIN SODIUM 40 MG/0.4ML ~~LOC~~ SOLN
40.0000 mg | SUBCUTANEOUS | Status: DC
  Administered 2017-03-04: 40 mg via SUBCUTANEOUS
  Filled 2017-03-04: qty 0.4

## 2017-03-04 MED ORDER — FLUOXETINE HCL 10 MG PO CAPS
10.0000 mg | ORAL_CAPSULE | Freq: Every day | ORAL | Status: DC
  Administered 2017-03-04 – 2017-03-05 (×2): 10 mg via ORAL
  Filled 2017-03-04 (×2): qty 1

## 2017-03-04 MED ORDER — RISPERIDONE 1 MG PO TABS
3.0000 mg | ORAL_TABLET | Freq: Three times a day (TID) | ORAL | Status: DC
  Administered 2017-03-04 – 2017-03-05 (×3): 3 mg via ORAL
  Filled 2017-03-04 (×3): qty 3

## 2017-03-04 MED ORDER — CEPHALEXIN 500 MG PO CAPS
500.0000 mg | ORAL_CAPSULE | Freq: Two times a day (BID) | ORAL | Status: DC
  Administered 2017-03-04 – 2017-03-05 (×3): 500 mg via ORAL
  Filled 2017-03-04 (×3): qty 1

## 2017-03-04 NOTE — NC FL2 (Signed)
[place] MEDICAID FL2 LEVEL OF CARE SCREENING TOOL     IDENTIFICATION  Patient Name: Lawrence Barajas Birthdate: 1961/01/18 Sex: male Admission Date (Current Location): 03/03/2017  [facility] and [place] Number:  [address]  ([phone] [id number]) Facility and Address:  [facility], [address], [place], [place] [address]      Provider Number: [id number]  Attending Physician Name and Address:  Delfino Lovett, MD  Relative Name and Phone Number:       Current Level of Care: Hospital Recommended Level of Care: Skilled Nursing Facility Prior Approval Number:    Date Approved/Denied:   PASRR Number:  ([id number] F. Expires 04/20/17)  Discharge Plan: SNF    Current Diagnoses: Patient Active Problem List   Diagnosis Date Noted  . Sepsis (HCC) 03/03/2017  . Edema of abdominal wall 12/02/2016  . Anasarca 12/02/2016  . Hepatic encephalopathy (HCC) 12/02/2016  . Hepatitis C 12/02/2016  . Schizoaffective disorder (HCC) 12/02/2016  . Alcohol abuse 12/02/2016  . Coagulopathy (HCC) 12/02/2016  . Scrotal edema 12/02/2016    Orientation RESPIRATION BLADDER Height & Weight     Self, Time, Situation, Place  Normal Continent Weight: (!) 368 lb (166.9 kg) Height:  6\' 3"  (190.5 cm)  BEHAVIORAL SYMPTOMS/MOOD NEUROLOGICAL BOWEL NUTRITION STATUS   (None.)  (None. ) Continent Diet (Diet: 2 gram sodium )  AMBULATORY STATUS COMMUNICATION OF NEEDS Skin   Limited Assist Verbally Normal                       Personal Care Assistance Level of Assistance  Bathing, Feeding, Dressing Bathing Assistance: Limited assistance Feeding assistance: Independent Dressing Assistance: Limited assistance     Functional Limitations Info  Sight, Hearing, Speech Sight Info: Adequate Hearing Info: Adequate Speech Info: Adequate    SPECIAL CARE FACTORS FREQUENCY  PT (By licensed PT), OT (By licensed OT)     PT Frequency:  (5) OT Frequency:  (5)            Contractures       Additional Factors Info  Code Status, Allergies Code Status Info:  (Full Code) Allergies Info:  (No Known Allergies)           Current Medications (03/04/2017):  This is the current hospital active medication list Current Facility-Administered Medications  Medication Dose Route Frequency Provider Last Rate Last Dose  . albuterol (PROVENTIL) (2.5 MG/3ML) 0.083% nebulizer solution 2.5 mg  2.5 mg Nebulization Q6H PRN Enedina Finner, MD      . cephALEXin (KEFLEX) capsule 500 mg  500 mg Oral Q12H Delfino Lovett, MD   500 mg at 03/04/17 0857  . docusate sodium (COLACE) capsule 100 mg  100 mg Oral BID Enedina Finner, MD   100 mg at 03/04/17 0856  . enoxaparin (LOVENOX) injection 40 mg  40 mg Subcutaneous Q24H Vipul Shah, MD      . FLUoxetine (PROZAC) capsule 10 mg  10 mg Oral Daily Vipul Shah, MD      . folic acid (FOLVITE) tablet 1 mg  1 mg Oral Daily Enedina Finner, MD   1 mg at 03/04/17 0856  . furosemide (LASIX) injection 40 mg  40 mg Intravenous BID Enedina Finner, MD   40 mg at 03/04/17 0857  . lactulose (CHRONULAC) 10 GM/15ML solution 30 g  30 g Oral TID Enedina Finner, MD   30 g at 03/04/17 0855  . LORazepam (ATIVAN) injection 0-4 mg  0-4 mg Intravenous Q6H Enedina Finner, MD  Followed by  . [START ON 03/05/2017] LORazepam (ATIVAN) injection 0-4 mg  0-4 mg Intravenous Q12H Enedina FinnerSona Patel, MD      . LORazepam (ATIVAN) tablet 1 mg  1 mg Oral Q6H PRN Enedina FinnerSona Patel, MD       Or  . LORazepam (ATIVAN) injection 1 mg  1 mg Intravenous Q6H PRN Enedina FinnerSona Patel, MD      . multivitamin with minerals tablet 1 tablet  1 tablet Oral Daily Enedina FinnerSona Patel, MD   1 tablet at 03/04/17 0857  . nicotine (NICODERM CQ - dosed in mg/24 hours) patch 21 mg  21 mg Transdermal Daily Enedina FinnerSona Patel, MD   21 mg at 03/04/17 0857  . ondansetron (ZOFRAN) tablet 4 mg  4 mg Oral Q6H PRN Enedina FinnerSona Patel, MD       Or  . ondansetron (ZOFRAN) injection 4 mg  4 mg Intravenous Q6H PRN Enedina FinnerSona Patel, MD      . pantoprazole (PROTONIX) EC tablet 40 mg  40 mg Oral Daily Enedina FinnerSona  Patel, MD   40 mg at 03/04/17 0856  . QUEtiapine (SEROQUEL) tablet 200 mg  200 mg Oral QHS Enedina FinnerSona Patel, MD   200 mg at 03/03/17 2026  . risperiDONE (RISPERDAL) tablet 3 mg  3 mg Oral TID Delfino LovettVipul Shah, MD      . spironolactone (ALDACTONE) tablet 100 mg  100 mg Oral Daily Enedina FinnerSona Patel, MD   100 mg at 03/04/17 0857  . thiamine (VITAMIN B-1) tablet 100 mg  100 mg Oral Daily Enedina FinnerSona Patel, MD   100 mg at 03/04/17 16100856   Or  . thiamine (B-1) injection 100 mg  100 mg Intravenous Daily Enedina FinnerSona Patel, MD      . traMADol Janean Sark(ULTRAM) tablet 50 mg  50 mg Oral Q6H PRN Enedina FinnerSona Patel, MD         Discharge Medications: Please see discharge summary for a list of discharge medications.  Relevant Imaging Results:  Relevant Lab Results:   Additional Information  (SSN: [id number])  Ralene BatheMackenzie Norbert Malkin, Student-Social Work

## 2017-03-04 NOTE — Progress Notes (Addendum)
[facility] - [place] at [facility]   PATIENT NAME: Lawrence Barajas    MR#:  [id number]  DATE OF BIRTH:  September 05, 1961  SUBJECTIVE:  CHIEF COMPLAINT:   Chief Complaint  Patient presents with  . Leg Swelling  wants more gingeral, says he don't have money so not taking meds. REVIEW OF SYSTEMS:  Review of Systems  Constitutional: Negative for chills, fever and weight loss.  HENT: Negative for nosebleeds and sore throat.   Eyes: Negative for blurred vision.  Respiratory: Negative for cough, shortness of breath and wheezing.   Cardiovascular: Positive for leg swelling. Negative for chest pain, orthopnea and PND.  Gastrointestinal: Negative for abdominal pain, constipation, diarrhea, heartburn, nausea and vomiting.  Genitourinary: Negative for dysuria and urgency.  Musculoskeletal: Negative for back pain.  Skin: Negative for rash.  Neurological: Negative for dizziness, speech change, focal weakness and headaches.  Endo/Heme/Allergies: Does not bruise/bleed easily.  Psychiatric/Behavioral: Negative for depression.   DRUG ALLERGIES:  No Known Allergies VITALS:  Blood pressure (!) 103/40, pulse (!) 58, temperature 98.2 F (36.8 C), temperature source Oral, resp. rate 16, height 6\' 3"  (1.905 m), weight (!) 166.9 kg (368 lb), SpO2 98 %. PHYSICAL EXAMINATION:  Physical Exam  Constitutional: He is oriented to person, place, and time and well-developed, well-nourished, and in no distress.  HENT:  Head: Normocephalic and atraumatic.  Eyes: Conjunctivae and EOM are normal. Pupils are equal, round, and reactive to light.  Neck: Normal range of motion. Neck supple. No tracheal deviation present. No thyromegaly present.  Cardiovascular: Normal rate, regular rhythm and normal heart sounds.   Pulmonary/Chest: Effort normal and breath sounds normal. No respiratory distress. He has no wheezes. He exhibits no tenderness.  Abdominal: Soft. Bowel sounds are normal. He exhibits no  distension. There is no tenderness.  Musculoskeletal: Normal range of motion. He exhibits edema.       Right shoulder: He exhibits no swelling.  Neurological: He is alert and oriented to person, place, and time. No cranial nerve deficit.  Skin: Skin is warm and dry. No rash noted. There is erythema.  Psychiatric: Mood and affect normal.   LABORATORY PANEL:  Male CBC  Recent Labs Lab 03/03/17 1326  WBC 18.3*  HGB 8.9*  HCT 25.1*  PLT 136*   ------------------------------------------------------------------------------------------------------------------ Chemistries   Recent Labs Lab 03/03/17 1326 03/04/17 0327  NA 132* 135  K 4.0 3.9  CL 108 112*  CO2 18* 19*  GLUCOSE 85 76  BUN 30* 32*  CREATININE 1.40* 1.32*  CALCIUM 8.1* 7.7*  AST 86*  --   ALT 32  --   ALKPHOS 127*  --   BILITOT 4.8*  --    RADIOLOGY:  Dg Chest Port 1 View  Result Date: 03/03/2017 CLINICAL DATA:  Swelling. EXAM: PORTABLE CHEST 1 VIEW COMPARISON:  01/06/2017 . FINDINGS: Cardiomegaly with pulmonary vascular prominence and bilateral interstitial prominence consistent CHF. Small left pleural effusion cannot be excluded. No pneumothorax. IMPRESSION: Cardiomegaly with bilateral from interstitial prominence suggesting CHF. Bilateral pneumonitis cannot be excluded. Small left pleural effusion cannot be excluded. Electronically Signed   By: Maisie Fus  Register   On: 03/03/2017 13:17   ASSESSMENT AND PLAN:  Lawrence Barajas  is a 56 y.o. male with a known history of HTN, Bilateral Lymphedema, on going alcohol abuse and Hepatitis C come to the ER with increasing leg edema andRedness over the right leg. Patient has been living at his friend's place and has been using alcohol every now and  then his most recent drink was yesterday 1-1/2 large cans of beer.  Anasarca, most likely secondary to liver disease and hypoalbuminemia Patient also has chronic lower extremity lymphedema. He has gained 40-50 pounds over the last  few weeks.  He does have significant scrotal edema.  - He has ran out of his Lasix. He has been noncompliant with follow-up. Noncompliant with diet. -Continues to drink alcohol -started on IV Lasix 40 mg twice a day and Aldactone 100 daily Patient initiated on IV Lasix. Monitor weights. strict ins and outs. Fluid restriction.  Continue to elevate the legs and continue IV diuresis, check daily weight.  Scrotal edema and pain This is most likely due to his anasarca.   Hepatic encephalopathy - elevated ammonia of 40, continue lactulose   History of alcohol abuse - monitor for any s/o withdrawal  History of hepatitis C with hyperbilirubinemia and coagulopathy - check INR. Monitor his liver function periodically.  Acute on Chronic diastolic CHF Based [facility] [place] he does have normal systolic function. Lasix as discussed above. Ins and outs and daily weights. Monitor renal function while he is getting diuresed.  History of schizoaffective disorder - continue Seroquel & Risperdal for now.  Normocytic anemia Monitor hemoglobin closely. No evidence for any bleeding at this time.  Hypokalemia -This is repleted with oral supplements.  Sepsis: Ruled out  Mild cellulitis - stop unasyn, start PO keflex  Leukocytosis: secondary to mild cellulitis   CM c/s meds assistance.    All the records are reviewed and case discussed with Care Management/Social Worker. Management plans discussed with the patient, family and they are in agreement.  CODE STATUS: Full Code  TOTAL TIME TAKING CARE OF THIS PATIENT: 35 minutes.   More than 50% of the time was spent in counseling/coordination of care: YES  POSSIBLE D/C IN 1-2 DAYS, DEPENDING ON CLINICAL CONDITION.   Delfino LovettVipul Maruice Pieroni M.D on 03/04/2017 at 8:15 AM  Between 7am to 6pm - Pager - [phone]  After 6pm go to [email or web address] - Scientist, research (life [email or web address])password [email or web address]  [facility] [place]  [facility]ospitalists  Office  [phone]  CC: Primary care physician; No PCP Per Patient  Note: This dictation was prepared with Dragon dictation along with smaller phrase technology. Any transcriptional errors that result from this process are unintentional.

## 2017-03-04 NOTE — Progress Notes (Signed)
VSS. Unable to place Ted hose related to size of lower extremities. Started first dose of lasix. Pt is resting.

## 2017-03-04 NOTE — Progress Notes (Signed)
Patient is A&O x4. CIWA zero. Incont of stool and urine, scheduled Lactulose given. Patient able to get up with SBA. Large BSC in room, patient incont of stool. Patient stays in bed with blankets over this head and lights out. Bed alarm on for safety. Good appetite. Refuses some meds. Pitting edema to bilat lower legs.

## 2017-03-04 NOTE — Clinical Social Work Note (Signed)
Clinical Social Work Assessment  Patient Details  Name: Lawrence Barajas MRN: [id number] Date of Birth: Apr 30, 1961  Date of referral:  03/04/17               Reason for consult:  Discharge Planning                Permission sought to share information with:  Case Manager Permission granted to share information::  Yes, Release of Information Signed  Name::        Agency::     Relationship::     Contact Information:     Housing/Transportation Living arrangements for the past 2 months:  Apartment, [place] of Information:  Patient Patient Interpreter Needed:  None Criminal Activity/Legal Involvement Pertinent to Current Situation/Hospitalization:  No - Comment as needed Significant Relationships:  Friend Lives with:  Friends Do you feel safe going back to the place where you live?  Yes Need for family participation in patient care:  Yes (Comment)  Care giving concerns:  Per patient he is currently staying at a friend's house in [place]. Patient would not give the friend's name because he stated his friend does not like for his name to be given out.    Social Worker assessment / plan:  Holiday representative (CSW) received consult for discharge planning. PT has attempted to see patient, however patient refused to work with PT. CSW met with patient alone at bedside to address consult. Patient was alert and oriented and was laying in the bed. CSW introduced self and explained role of CSW department. Patient reported that he was recently at a SNF in [facility] and only stayed 3 days because of "bed bugs." Patient reported that he could not remember the name of the facility. Per patient after he left the facility he came to [place] to stay with his friend. Patient reported that he can walk on his own. Patient refused to give the name of his friend that he is staying with. Patient reported that he is on disability and has medicaid. Patient reported that he can't afford his  medications. CSW explained that patient has medicaid and assistance with medications will be limited. Patient stated "you are not telling me something I don't know." Patient reported that his plan is to return to his friend's house and stated that he will need a ride there. CSW asked patient what he normally does for transportation and he stated I don't know. Patient reported that before he went in to the skilled nursing facility in [facility] he was homeless. CSW provided patient with a list of [employer].   CSW reviewed chart and noted that [facility] December [facility]. CSW contacted [facility] admissions coordinator who stated that patient left the facility AMA and is not eligible to return.     Employment status:  Disabled (Comment on whether or not currently receiving Disability) Insurance information:  Medicaid In [place] PT Recommendations:  Not assessed at this time Information / Referral to community resources:  [place], Other (Comment Required) (Patient reported that he is going back to his friend's house in [place]. )  Patient/Family's Response to care:  Patient plans on returning to his friend's house in [place].   Patient/Family's Understanding of and Emotional Response to Diagnosis, Current Treatment, and Prognosis:  Patient appeared agitated during assessment.   Emotional Assessment Appearance:  Appears older than stated age Attitude/Demeanor/Rapport:  Guarded Affect (typically observed):  Frustrated, Guarded, Agitated, Irritable Orientation:  Oriented to Place, Oriented to Self, Oriented to  Time, Oriented to Situation Alcohol / Substance use:  Not Applicable Psych involvement (Current and /or in the community):  No (Comment)  Discharge Needs  Concerns to be addressed:  Discharge Planning Concerns Readmission within the last 30 days:  No Current discharge risk:  Dependent  with Mobility Barriers to Discharge:  Continued Medical Work up   [employer], Veronia Beets, LCSW 03/04/2017, 3:12 PM

## 2017-03-04 NOTE — Progress Notes (Signed)
PT Cancellation Note  Patient Details Name: Lawrence MustacheDarren Mundorf MRN: [id number] DOB: 05/09/1961   Cancelled Treatment:    Reason Eval/Treat Not Completed: Other (comment) (Consult received and chart reviewed.  Evaluation attempted x2 this AM.  First attempt, patient eating breakfast.  Second attempt, patient refusing due to fatigue.  Unable to promote participation despite encouragement; patient mildly agitated with continued efforts.  Will re-attempt at later time/date as patient medically appropriate and agreeable.)   Kalise Fickett H. Manson PasseyBrown, PT, DPT, NCS 03/04/17, 9:31 AM [phone]

## 2017-03-05 LAB — CBC
HEMATOCRIT: 23.9 % — AB (ref 40.0–52.0)
HEMOGLOBIN: 8.5 g/dL — AB (ref 13.0–18.0)
MCH: 36.7 pg — ABNORMAL HIGH (ref 26.0–34.0)
MCHC: 35.5 g/dL (ref 32.0–36.0)
MCV: 103.5 fL — AB (ref 80.0–100.0)
Platelets: 126 10*3/uL — ABNORMAL LOW (ref 150–440)
RBC: 2.31 MIL/uL — ABNORMAL LOW (ref 4.40–5.90)
RDW: 18 % — AB (ref 11.5–14.5)
WBC: 13 10*3/uL — AB (ref 3.8–10.6)

## 2017-03-05 LAB — BASIC METABOLIC PANEL
ANION GAP: 5 (ref 5–15)
BUN: 35 mg/dL — ABNORMAL HIGH (ref 6–20)
CALCIUM: 7.8 mg/dL — AB (ref 8.9–10.3)
CHLORIDE: 111 mmol/L (ref 101–111)
CO2: 20 mmol/L — AB (ref 22–32)
Creatinine, Ser: 1.77 mg/dL — ABNORMAL HIGH (ref 0.61–1.24)
GFR calc Af Amer: 48 mL/min — ABNORMAL LOW (ref >60)
GFR calc non Af Amer: 41 mL/min — ABNORMAL LOW (ref >60)
GLUCOSE: 88 mg/dL (ref 65–99)
POTASSIUM: 3.8 mmol/L (ref 3.5–5.1)
Sodium: 136 mmol/L (ref 135–145)

## 2017-03-05 MED ORDER — LACTULOSE 10 GM/15ML PO SOLN: 30.0000 g | Freq: Three times a day (TID) | ORAL | 0 refills | Status: DC

## 2017-03-05 MED ORDER — ENOXAPARIN SODIUM 40 MG/0.4ML ~~LOC~~ SOLN
40.0000 mg | Freq: Two times a day (BID) | SUBCUTANEOUS | Status: DC
  Administered 2017-03-05: 40 mg via SUBCUTANEOUS
  Filled 2017-03-05: qty 0.4

## 2017-03-05 MED ORDER — SPIRONOLACTONE 100 MG PO TABS
100.0000 mg | ORAL_TABLET | Freq: Every day | ORAL | 0 refills | Status: DC
Start: 2017-03-06 — End: 2017-08-14

## 2017-03-05 MED ORDER — FUROSEMIDE 40 MG PO TABS: 40.0000 mg | ORAL_TABLET | Freq: Every day | ORAL | 0 refills | Status: DC

## 2017-03-05 MED ORDER — TORSEMIDE 20 MG PO TABS: 20.0000 mg | ORAL_TABLET | Freq: Every day | ORAL | 0 refills | Status: DC

## 2017-03-05 MED ORDER — FLUOXETINE HCL 10 MG PO CAPS: 10.0000 mg | ORAL_CAPSULE | Freq: Every day | ORAL | 0 refills | Status: DC

## 2017-03-05 NOTE — Progress Notes (Signed)
Patient is alert and oriented and able to verbalize needs. No complaints of pain at this time. Discharge instructions gone over with patient, AVS printed and given to patient at this time. Patient verbalized understanding of all discharge instructions. PIV discontinued. Vital signs stable. EMS called for transport to home.

## 2017-03-05 NOTE — Care Management Note (Addendum)
Case Management Note  Patient Details  Name: Lawrence MustacheDarren Barajas MRN: [id number] Date of Birth: 07/14/1961  Subjective/Objective: Spoke with patient regarding discharge planning. Verified discharging address. Patient states he does not have a PCP. He has been seeing multiple different physicians in the past but not one specific. He reports he cant afford the 43 copay but is still smoking and drinking ETOH. No other resources available to assist with $3 copay. Encouraged him to connect with a new PCP and work closely with one MD.                  Action/Plan:   Expected Discharge Date:  03/05/17               Expected Discharge Plan:  Home/Self Care  In-House Referral:     Discharge planning Services  CM Consult  Post Acute Care Choice:    Choice offered to:     DME Arranged:    DME Agency:     HH Arranged:    HH Agency:     Status of Service:  Completed, signed off  If discussed at [employer] Length of Stay Meetings, dates discussed:    Additional Comments:  Marily MemosLisa M Maisley Hainsworth, RN 03/05/2017, 10:02 AM

## 2017-03-05 NOTE — Discharge Instructions (Signed)
Edema Edema is an abnormal buildup of fluids in your bodytissues. Edema is somewhatdependent on gravity to pull the fluid to the lowest place in your body. That makes the condition more common in the legs and thighs (lower extremities). Painless swelling of the feet and ankles is common and becomes more likely as you get older. It is also common in looser tissues, like around your eyes. When the affected area is squeezed, the fluid may move out of that spot and leave a dent for a few moments. This dent is called pitting. What are the causes? There are many possible causes of edema. Eating too much salt and being on your feet or sitting for a long time can cause edema in your legs and ankles. Hot weather may make edema worse. Common medical causes of edema include:  Heart failure.  Liver disease.  Kidney disease.  Weak blood vessels in your legs.  Cancer.  An injury.  Pregnancy.  Some medications.  Obesity.  What are the signs or symptoms? Edema is usually painless.Your skin may look swollen or shiny. How is this diagnosed? Your health care provider may be able to diagnose edema by asking about your medical history and doing a physical exam. You may need to have tests such as X-rays, an electrocardiogram, or blood tests to check for medical conditions that may cause edema. How is this treated? Edema treatment depends on the cause. If you have heart, liver, or kidney disease, you need the treatment appropriate for these conditions. General treatment may include:  Elevation of the affected body part above the level of your heart.  Compression of the affected body part. Pressure from elastic bandages or support stockings squeezes the tissues and forces fluid back into the blood vessels. This keeps fluid from entering the tissues.  Restriction of fluid and salt intake.  Use of a water pill (diuretic). These medications are appropriate only for some types of edema. They pull fluid  out of your body and make you urinate more often. This gets rid of fluid and reduces swelling, but diuretics can have side effects. Only use diuretics as directed by your health care provider.  Follow these instructions at home:  Keep the affected body part above the level of your heart when you are lying down.  Do not sit still or stand for prolonged periods.  Do not put anything directly under your knees when lying down.  Do not wear constricting clothing or garters on your upper legs.  Exercise your legs to work the fluid back into your blood vessels. This may help the swelling go down.  Wear elastic bandages or support stockings to reduce ankle swelling as directed by your health care provider.  Eat a low-salt diet to reduce fluid if your health care provider recommends it.  Only take medicines as directed by your health care provider. Contact a health care provider if:  Your edema is not responding to treatment.  You have heart, liver, or kidney disease and notice symptoms of edema.  You have edema in your legs that does not improve after elevating them.  You have sudden and unexplained weight gain. Get help right away if:  You develop shortness of breath or chest pain.  You cannot breathe when you lie down.  You develop pain, redness, or warmth in the swollen areas.  You have heart, liver, or kidney disease and suddenly get edema.  You have a fever and your symptoms suddenly get worse. This information is   not intended to replace advice given to you by your health care provider. Make sure you discuss any questions you have with your health care provider. Document Released: 12/02/2005 Document Revised: 05/09/2016 Document Reviewed: 09/24/2013 Elsevier Interactive Patient Education  2017 Elsevier Inc.  

## 2017-03-05 NOTE — Progress Notes (Signed)
PT Cancellation Note  Patient Details Name: Lawrence Barajas MRN: [id number] DOB: 06/19/1961   Cancelled Treatment:    Reason Eval/Treat Not Completed: Patient declined, no reason specified.  Pt reports, "I know what you're here for and I just want to be left alone".  Pt educated on the benefits of mobility and potential consequences of delaying mobility but continues to refuse PT at this time. PT will continue to follow acutely and will attempt PT Evaluation if pt is agreeable.   Encarnacion ChuAshley Abashian PT, DPT 03/05/2017, 10:31 AM

## 2017-03-05 NOTE — Progress Notes (Signed)
Order for enoxaparin 40 mg subcutaneously daily changed to BID dose for CrCl > 30 mL/min and BMI > 40 per anticoagulation protocol.  Cindi CarbonMary M Cher Egnor, PharmD Clinical Pharmacist 03/05/17 8:44 AM

## 2017-03-08 LAB — CULTURE, BLOOD (ROUTINE X 2)
CULTURE: NO GROWTH
CULTURE: NO GROWTH

## 2017-03-08 NOTE — Discharge Summary (Signed)
[facility] [place] [facility]   PATIENT NAME: Lawrence Barajas    MR#:  [id number]  DATE OF BIRTH:  09/15/1961  DATE OF ADMISSION:  03/03/2017   ADMITTING PHYSICIAN: Enedina Finner, MD  DATE OF DISCHARGE: 03/05/2017  3:12 PM  PRIMARY CARE PHYSICIAN: De Blanch, FNP   ADMISSION DIAGNOSIS:  Cellulitis of right lower extremity [L03.115] DISCHARGE DIAGNOSIS:  Active Problems:   Sepsis (HCC)  SECONDARY DIAGNOSIS:   Past Medical History:  Diagnosis Date  . Bronchitis   . Hepatitis C   . Hypertension   . Leg pain, bilateral   . Pancreatitis, alcoholic, acute    HOSPITAL COURSE:  Lawrence Barajas a 56 y.o. malewith a known history of HTN, Bilateral Lymphedema, on going alcohol abuse and Hepatitis C come to the ER with increasing leg edema andRedness over the right leg. Patient has been living at his friend's place and has been using alcohol every now and then his most recent drink was yesterday 1-1/2 large cans of beer.  Sepsis - ruled out  Anasarca, most likely secondary to liver disease and hypoalbuminemia Patient has chronic lower extremity lymphedema. He does have significant scrotal edema.  - He has ran out of his Lasix. He has been noncompliant with follow-up. Noncompliant with diet. -Continues to drink alcohol - restarted on Lasix and Aldactone and explained importance of medication compliance  Scrotal edema and pain  likely due to his anasarca.   Hepatic encephalopathy - elevated ammonia of 40, continue lactulose   History of alcohol abuse  History of hepatitis C with hyperbilirubinemia and coagulopathy  Acute on Chronic diastolic CHF Based on an echocardiogram done at [facility] in [place] he does have normal systolic function.  - well compensated  History of schizoaffective disorder - continue Seroquel & Risperdal for now.  Normocytic anemia Monitor hemoglobin closely. No evidence for any bleeding at  this time.  Hypokalemia -This is repleted with oral supplements.  Sepsis: Ruled out  Mild cellulitis - stop unasyn, treated with PO keflex  Leukocytosis: secondary to mild cellulitis  DISCHARGE CONDITIONS:  stable CONSULTS OBTAINED:   DRUG ALLERGIES:  No Known Allergies DISCHARGE MEDICATIONS:   Allergies as of 03/05/2017   No Known Allergies     Medication List    TAKE these medications   Cetirizine HCl 10 MG Caps Commonly known as:  ZYRTEC ALLERGY Take 1 capsule (10 mg total) by mouth at bedtime as needed (ear pain).   FLUoxetine 10 MG capsule Commonly known as:  PROZAC Take 1 capsule (10 mg total) by mouth daily.   fluticasone 50 MCG/ACT nasal spray Commonly known as:  FLONASE Place 1 spray into both nostrils daily.   folic acid 1 MG tablet Commonly known as:  FOLVITE Take 1 mg by mouth daily.   furosemide 40 MG tablet Commonly known as:  LASIX Take 1 tablet (40 mg total) by mouth daily. What changed:  when to take this   lactulose 10 GM/15ML solution Commonly known as:  CHRONULAC Take 45 mLs (30 g total) by mouth 3 (three) times daily.   MULTI-VITAMINS Tabs Take 1 tablet by mouth daily.   pantoprazole 40 MG tablet Commonly known as:  PROTONIX Take 40 mg by mouth daily.   potassium chloride 20 MEQ packet Commonly known as:  KLOR-CON Take 20 mEq by mouth daily.   QUEtiapine 200 MG tablet Commonly known as:  SEROQUEL Take 200 mg by mouth at bedtime.   rifaximin 550  MG Tabs tablet Commonly known as:  XIFAXAN Take 1 tablet (550 mg total) by mouth 3 (three) times daily.   risperidone 3 MG disintegrating tablet Commonly known as:  RISPERDAL M-TABS Take 3 mg by mouth 3 (three) times daily.   sodium bicarbonate 650 MG tablet Take 650 mg by mouth 2 (two) times daily.   spironolactone 100 MG tablet Commonly known as:  ALDACTONE Take 1 tablet (100 mg total) by mouth daily.   thiamine 100 MG tablet Take 100 mg by mouth daily.   torsemide  20 MG tablet Commonly known as:  DEMADEX Take 1 tablet (20 mg total) by mouth daily.   traMADol 50 MG tablet Commonly known as:  ULTRAM Take 1 tablet (50 mg total) by mouth every 6 (six) hours as needed.   traZODone 50 MG tablet Commonly known as:  DESYREL Take 50 mg by mouth at bedtime.        DISCHARGE INSTRUCTIONS:   DIET:  Regular diet DISCHARGE CONDITION:  Good ACTIVITY:  Activity as tolerated OXYGEN:  Home Oxygen: No.  Oxygen Delivery: room air DISCHARGE LOCATION:  home   If you experience worsening of your admission symptoms, develop shortness of breath, life threatening emergency, suicidal or homicidal thoughts you must seek medical attention immediately by calling 911 or calling your MD immediately  if symptoms less severe.  You Must read complete instructions/literature along with all the possible adverse reactions/side effects for all the Medicines you take and that have been prescribed to you. Take any new Medicines after you have completely understood and accpet all the possible adverse reactions/side effects.   Please note  You were cared for by a hospitalist during your hospital stay. If you have any questions about your discharge medications or the care you received while you were in the hospital after you are discharged, you can call the unit and asked to speak with the hospitalist on call if the hospitalist that took care of you is not available. Once you are discharged, your primary care physician will handle any further medical issues. Please note that NO REFILLS for any discharge medications will be authorized once you are discharged, as it is imperative that you return to your primary care physician (or establish a relationship with a primary care physician if you do not have one) for your aftercare needs so that they can reassess your need for medications and monitor your lab values.    On the day of Discharge:  VITAL SIGNS:  Blood pressure (!) 108/59,  pulse 84, temperature 98.3 F (36.8 C), temperature source Oral, resp. rate 20, height 6\' 3"  (1.905 m), weight (!) 166.9 kg (368 lb), SpO2 98 %. PHYSICAL EXAMINATION:  GENERAL:  56 y.o.-year-old patient lying in the bed with no acute distress.  EYES: Pupils equal, round, reactive to light and accommodation. No scleral icterus. Extraocular muscles intact.  HEENT: Head atraumatic, normocephalic. Oropharynx and nasopharynx clear.  NECK:  Supple, no jugular venous distention. No thyroid enlargement, no tenderness.  LUNGS: Normal breath sounds bilaterally, no wheezing, rales,rhonchi or crepitation. No use of accessory muscles of respiration.  CARDIOVASCULAR: S1, S2 normal. No murmurs, rubs, or gallops.  ABDOMEN: Soft, non-tender, non-distended. Bowel sounds present. No organomegaly or mass.  EXTREMITIES: No pedal edema, cyanosis, or clubbing.  NEUROLOGIC: Cranial nerves II through XII are intact. Muscle strength 5/5 in all extremities. Sensation intact. Gait not checked.  PSYCHIATRIC: The patient is alert and oriented x 3.  SKIN: No obvious rash, lesion, or ulcer.  DATA REVIEW:   CBC  Recent Labs Lab 03/05/17 0344  WBC 13.0*  HGB 8.5*  HCT 23.9*  PLT 126*    Chemistries   Recent Labs Lab 03/03/17 1326  03/05/17 0344  NA 132*  < > 136  K 4.0  < > 3.8  CL 108  < > 111  CO2 18*  < > 20*  GLUCOSE 85  < > 88  BUN 30*  < > 35*  CREATININE 1.40*  < > 1.77*  CALCIUM 8.1*  < > 7.8*  AST 86*  --   --   ALT 32  --   --   ALKPHOS 127*  --   --   BILITOT 4.8*  --   --   < > = values in this interval not displayed.   Follow-up Information    Sherlynn StallsFAWCETT-HARDY, Catha BrowJUNE ANNE, FNP. Schedule an appointment as soon as possible for a visit in 1 week(s).   Specialty:  Family Medicine Contact information: [address] [place] [address] [phone]           Management plans discussed with the patient, family and they are in agreement.  CODE STATUS: Prior   TOTAL TIME TAKING CARE  OF THIS PATIENT: 45 minutes.    Delfino LovettVipul Myrikal Messmer M.D on 03/08/2017 at 12:15 PM  Between 7am to 6pm - Pager - [phone]  After 6pm go to [email or web address] - Social research officer, governmentpassword [email or web address] [email or web address]  [facility]  Office  [phone]  CC: Primary care physician; De BlanchFAWCETT-HARDY, JUNE ANNE, FNP   Note: This dictation was prepared with Dragon dictation along with smaller phrase technology. Any transcriptional errors that result from this process are unintentional.

## 2017-05-06 ENCOUNTER — Encounter: Payer: Self-pay | Admitting: Emergency Medicine

## 2017-05-06 ENCOUNTER — Emergency Department
Admission: EM | Admit: 2017-05-06 | Discharge: 2017-05-06 | Disposition: A | Discharge status: Home / Self Care | Payer: Medicaid Other | Attending: Emergency Medicine | Admitting: Emergency Medicine

## 2017-05-06 DIAGNOSIS — Z79899 Other long term (current) drug therapy: Secondary | ICD-10-CM | POA: Diagnosis not present

## 2017-05-06 DIAGNOSIS — R21 Rash and other nonspecific skin eruption: Secondary | ICD-10-CM | POA: Diagnosis present

## 2017-05-06 DIAGNOSIS — F17210 Nicotine dependence, cigarettes, uncomplicated: Secondary | ICD-10-CM | POA: Insufficient documentation

## 2017-05-06 DIAGNOSIS — I10 Essential (primary) hypertension: Secondary | ICD-10-CM | POA: Diagnosis not present

## 2017-05-06 DIAGNOSIS — K5900 Constipation, unspecified: Secondary | ICD-10-CM | POA: Diagnosis not present

## 2017-05-06 DIAGNOSIS — B029 Zoster without complications: Secondary | ICD-10-CM | POA: Diagnosis not present

## 2017-05-06 LAB — URINALYSIS, COMPLETE (UACMP) WITH MICROSCOPIC
BACTERIA UA: NONE SEEN
Bilirubin Urine: NEGATIVE
GLUCOSE, UA: NEGATIVE mg/dL
KETONES UR: NEGATIVE mg/dL
LEUKOCYTES UA: NEGATIVE
Nitrite: NEGATIVE
PROTEIN: NEGATIVE mg/dL
Specific Gravity, Urine: 1.015 (ref 1.005–1.030)
pH: 5 (ref 5.0–8.0)

## 2017-05-06 LAB — COMPREHENSIVE METABOLIC PANEL
ALBUMIN: 2.4 g/dL — AB (ref 3.5–5.0)
ALT: 67 U/L — AB (ref 17–63)
ANION GAP: 4 — AB (ref 5–15)
AST: 176 U/L — AB (ref 15–41)
Alkaline Phosphatase: 166 U/L — ABNORMAL HIGH (ref 38–126)
BUN: 18 mg/dL (ref 6–20)
CO2: 25 mmol/L (ref 22–32)
Calcium: 9 mg/dL (ref 8.9–10.3)
Chloride: 105 mmol/L (ref 101–111)
Creatinine, Ser: 1.28 mg/dL — ABNORMAL HIGH (ref 0.61–1.24)
GFR calc Af Amer: >60 mL/min (ref >60)
GFR calc non Af Amer: >60 mL/min (ref >60)
GLUCOSE: 117 mg/dL — AB (ref 65–99)
POTASSIUM: 5.6 mmol/L — AB (ref 3.5–5.1)
SODIUM: 134 mmol/L — AB (ref 135–145)
TOTAL PROTEIN: 8.3 g/dL — AB (ref 6.5–8.1)
Total Bilirubin: 3.4 mg/dL — ABNORMAL HIGH (ref 0.3–1.2)

## 2017-05-06 LAB — CBC
HCT: 36.3 % — ABNORMAL LOW (ref 40.0–52.0)
Hemoglobin: 12.7 g/dL — ABNORMAL LOW (ref 13.0–18.0)
MCH: 35.1 pg — ABNORMAL HIGH (ref 26.0–34.0)
MCHC: 35 g/dL (ref 32.0–36.0)
MCV: 100.4 fL — ABNORMAL HIGH (ref 80.0–100.0)
Platelets: 234 10*3/uL (ref 150–440)
RBC: 3.61 MIL/uL — ABNORMAL LOW (ref 4.40–5.90)
RDW: 17.9 % — ABNORMAL HIGH (ref 11.5–14.5)
WBC: 5.6 10*3/uL (ref 3.8–10.6)

## 2017-05-06 LAB — LIPASE, BLOOD: LIPASE: 46 U/L (ref 11–51)

## 2017-05-06 MED ORDER — POLYETHYLENE GLYCOL 3350 17 G PO PACK: 17.0000 g | PACK | Freq: Every day | ORAL | 0 refills | Status: DC | PRN

## 2017-05-06 MED ORDER — OXYCODONE HCL 5 MG PO TABS
5.0000 mg | ORAL_TABLET | Freq: Once | ORAL | Status: AC
  Administered 2017-05-06: 5 mg via ORAL
  Filled 2017-05-06: qty 1

## 2017-05-06 MED ORDER — ACYCLOVIR 400 MG PO TABS: 800.0000 mg | ORAL_TABLET | Freq: Every day | ORAL | 0 refills | Status: AC

## 2017-05-06 MED ORDER — ACYCLOVIR 200 MG PO CAPS
800.0000 mg | ORAL_CAPSULE | Freq: Once | ORAL | Status: AC
  Administered 2017-05-06: 800 mg via ORAL
  Filled 2017-05-06: qty 4

## 2017-05-06 NOTE — ED Notes (Signed)
Patient is asking for a Child psychotherapistsocial worker, pain medication, warm blanket, remote and something to drink. Explained to patient that the doctor will have to order the social worker, pain medication and give us the ok for water. Provided patient with warm blanket and remote.

## 2017-05-06 NOTE — ED Triage Notes (Signed)
Rash to right chest and back.  Patient also c/o abdominal pain and constipation.

## 2017-05-06 NOTE — ED Notes (Signed)
ED Provider at bedside. 

## 2017-05-06 NOTE — ED Notes (Signed)
Brother is a Naval architecttruck driver and [place] the [place] to [place] so he is unable to pick brother up. Does not know how to get him home.
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
## 2017-05-06 NOTE — ED Notes (Signed)
[phone] brother in chart Lawrence Barajas. Called and left generic voicemail to return my call.

## 2017-05-06 NOTE — ED Notes (Signed)
Patient d/c to lobby. Explained to patient that bus stops at 5:00pm. Patient states he does not know a number to call for a ride. Patient placed beside the courtesy phone. First RN notified as well. Patient was asking this RN how far the walk was from here to [place], advised patient that he would be better off sitting in the lobby until bus runs.

## 2017-05-06 NOTE — ED Notes (Signed)
Patient given medication. On med hold. Patient does not have any money for a cab, has no one he can call for a ride. Will check with charge.

## 2017-08-08 ENCOUNTER — Inpatient Hospital Stay
Admission: EM | Admit: 2017-08-08 | Discharge: 2017-08-14 | DRG: 871 | Disposition: A | Discharge status: Skilled Nursing Facility | Payer: Medicaid Other | Attending: Internal Medicine | Admitting: Internal Medicine

## 2017-08-08 ENCOUNTER — Encounter: Payer: Self-pay | Admitting: Emergency Medicine

## 2017-08-08 ENCOUNTER — Emergency Department: Payer: Medicaid Other

## 2017-08-08 DIAGNOSIS — G9341 Metabolic encephalopathy: Secondary | ICD-10-CM | POA: Diagnosis present

## 2017-08-08 DIAGNOSIS — L03115 Cellulitis of right lower limb: Secondary | ICD-10-CM | POA: Diagnosis present

## 2017-08-08 DIAGNOSIS — X58XXXA Exposure to other specified factors, initial encounter: Secondary | ICD-10-CM | POA: Diagnosis present

## 2017-08-08 DIAGNOSIS — I5032 Chronic diastolic (congestive) heart failure: Secondary | ICD-10-CM | POA: Diagnosis present

## 2017-08-08 DIAGNOSIS — Y92230 Patient room in hospital as the place of occurrence of the external cause: Secondary | ICD-10-CM | POA: Diagnosis present

## 2017-08-08 DIAGNOSIS — F17210 Nicotine dependence, cigarettes, uncomplicated: Secondary | ICD-10-CM | POA: Diagnosis present

## 2017-08-08 DIAGNOSIS — Z6841 Body Mass Index (BMI) 40.0 and over, adult: Secondary | ICD-10-CM | POA: Diagnosis not present

## 2017-08-08 DIAGNOSIS — N179 Acute kidney failure, unspecified: Secondary | ICD-10-CM | POA: Diagnosis present

## 2017-08-08 DIAGNOSIS — Z79899 Other long term (current) drug therapy: Secondary | ICD-10-CM

## 2017-08-08 DIAGNOSIS — B1920 Unspecified viral hepatitis C without hepatic coma: Secondary | ICD-10-CM | POA: Diagnosis present

## 2017-08-08 DIAGNOSIS — D638 Anemia in other chronic diseases classified elsewhere: Secondary | ICD-10-CM | POA: Diagnosis present

## 2017-08-08 DIAGNOSIS — R4182 Altered mental status, unspecified: Secondary | ICD-10-CM | POA: Diagnosis not present

## 2017-08-08 DIAGNOSIS — I890 Lymphedema, not elsewhere classified: Secondary | ICD-10-CM | POA: Diagnosis present

## 2017-08-08 DIAGNOSIS — L03116 Cellulitis of left lower limb: Secondary | ICD-10-CM | POA: Diagnosis present

## 2017-08-08 DIAGNOSIS — Z915 Personal history of self-harm: Secondary | ICD-10-CM

## 2017-08-08 DIAGNOSIS — J1529 Pneumonia due to other staphylococcus: Secondary | ICD-10-CM | POA: Diagnosis present

## 2017-08-08 DIAGNOSIS — E872 Acidosis: Secondary | ICD-10-CM | POA: Diagnosis present

## 2017-08-08 DIAGNOSIS — I110 Hypertensive heart disease with heart failure: Secondary | ICD-10-CM | POA: Diagnosis present

## 2017-08-08 DIAGNOSIS — T501X5A Adverse effect of loop [high-ceiling] diuretics, initial encounter: Secondary | ICD-10-CM | POA: Diagnosis present

## 2017-08-08 DIAGNOSIS — Z590 Homelessness: Secondary | ICD-10-CM

## 2017-08-08 DIAGNOSIS — E871 Hypo-osmolality and hyponatremia: Secondary | ICD-10-CM | POA: Diagnosis present

## 2017-08-08 DIAGNOSIS — I959 Hypotension, unspecified: Secondary | ICD-10-CM | POA: Diagnosis not present

## 2017-08-08 DIAGNOSIS — R6520 Severe sepsis without septic shock: Secondary | ICD-10-CM | POA: Diagnosis present

## 2017-08-08 DIAGNOSIS — A411 Sepsis due to other specified staphylococcus: Secondary | ICD-10-CM | POA: Diagnosis present

## 2017-08-08 DIAGNOSIS — F319 Bipolar disorder, unspecified: Secondary | ICD-10-CM | POA: Diagnosis present

## 2017-08-08 DIAGNOSIS — E860 Dehydration: Secondary | ICD-10-CM | POA: Diagnosis present

## 2017-08-08 DIAGNOSIS — Z9119 Patient's noncompliance with other medical treatment and regimen: Secondary | ICD-10-CM

## 2017-08-08 DIAGNOSIS — J189 Pneumonia, unspecified organism: Secondary | ICD-10-CM

## 2017-08-08 DIAGNOSIS — F259 Schizoaffective disorder, unspecified: Secondary | ICD-10-CM | POA: Diagnosis present

## 2017-08-08 DIAGNOSIS — R7881 Bacteremia: Secondary | ICD-10-CM | POA: Diagnosis not present

## 2017-08-08 DIAGNOSIS — B9562 Methicillin resistant Staphylococcus aureus infection as the cause of diseases classified elsewhere: Secondary | ICD-10-CM | POA: Diagnosis present

## 2017-08-08 DIAGNOSIS — A419 Sepsis, unspecified organism: Secondary | ICD-10-CM | POA: Diagnosis present

## 2017-08-08 DIAGNOSIS — S70311A Abrasion, right thigh, initial encounter: Secondary | ICD-10-CM | POA: Diagnosis present

## 2017-08-08 DIAGNOSIS — K7460 Unspecified cirrhosis of liver: Secondary | ICD-10-CM | POA: Diagnosis present

## 2017-08-08 HISTORY — DX: Schizoaffective disorder, unspecified: F25.9

## 2017-08-08 HISTORY — DX: Bipolar disorder, unspecified: F31.9

## 2017-08-08 HISTORY — DX: Schizoaffective disorder, bipolar type: F25.0

## 2017-08-08 LAB — CBC WITH DIFFERENTIAL/PLATELET
BASOS ABS: 0 10*3/uL (ref 0–0.1)
Basophils Relative: 0 %
EOS PCT: 0 %
Eosinophils Absolute: 0 10*3/uL (ref 0–0.7)
HEMATOCRIT: 26.1 % — AB (ref 40.0–52.0)
Hemoglobin: 9.2 g/dL — ABNORMAL LOW (ref 13.0–18.0)
LYMPHS ABS: 0.7 10*3/uL — AB (ref 1.0–3.6)
LYMPHS PCT: 6 %
MCH: 36.3 pg — AB (ref 26.0–34.0)
MCHC: 35.2 g/dL (ref 32.0–36.0)
MCV: 102.9 fL — AB (ref 80.0–100.0)
MONO ABS: 0.9 10*3/uL (ref 0.2–1.0)
MONOS PCT: 7 %
Neutro Abs: 10.9 10*3/uL — ABNORMAL HIGH (ref 1.4–6.5)
Neutrophils Relative %: 87 %
PLATELETS: 163 10*3/uL (ref 150–440)
RBC: 2.53 MIL/uL — ABNORMAL LOW (ref 4.40–5.90)
RDW: 16.7 % — AB (ref 11.5–14.5)
WBC: 12.6 10*3/uL — ABNORMAL HIGH (ref 3.8–10.6)

## 2017-08-08 LAB — COMPREHENSIVE METABOLIC PANEL
ALT: 38 U/L (ref 17–63)
AST: 119 U/L — AB (ref 15–41)
Albumin: 1.9 g/dL — ABNORMAL LOW (ref 3.5–5.0)
Alkaline Phosphatase: 155 U/L — ABNORMAL HIGH (ref 38–126)
Anion gap: 4 — ABNORMAL LOW (ref 5–15)
BILIRUBIN TOTAL: 4.7 mg/dL — AB (ref 0.3–1.2)
BUN: 18 mg/dL (ref 6–20)
CO2: 20 mmol/L — AB (ref 22–32)
Calcium: 8.2 mg/dL — ABNORMAL LOW (ref 8.9–10.3)
Chloride: 109 mmol/L (ref 101–111)
Creatinine, Ser: 1.07 mg/dL (ref 0.61–1.24)
GFR calc Af Amer: >60 mL/min (ref >60)
GFR calc non Af Amer: >60 mL/min (ref >60)
Glucose, Bld: 101 mg/dL — ABNORMAL HIGH (ref 65–99)
POTASSIUM: 3.8 mmol/L (ref 3.5–5.1)
SODIUM: 133 mmol/L — AB (ref 135–145)
TOTAL PROTEIN: 6.8 g/dL (ref 6.5–8.1)

## 2017-08-08 LAB — LACTIC ACID, PLASMA
LACTIC ACID, VENOUS: 3 mmol/L — AB (ref 0.5–1.9)
Lactic Acid, Venous: 2.3 mmol/L (ref 0.5–1.9)

## 2017-08-08 LAB — URINALYSIS, COMPLETE (UACMP) WITH MICROSCOPIC
BACTERIA UA: NONE SEEN
Glucose, UA: NEGATIVE mg/dL
Hgb urine dipstick: NEGATIVE
Ketones, ur: 5 mg/dL — AB
Leukocytes, UA: NEGATIVE
Nitrite: NEGATIVE
PH: 5 (ref 5.0–8.0)
Protein, ur: 30 mg/dL — AB
SPECIFIC GRAVITY, URINE: 1.024 (ref 1.005–1.030)

## 2017-08-08 LAB — AMMONIA: AMMONIA: 75 umol/L — AB (ref 9–35)

## 2017-08-08 LAB — BRAIN NATRIURETIC PEPTIDE: B NATRIURETIC PEPTIDE 5: 343 pg/mL — AB (ref 0.0–100.0)

## 2017-08-08 LAB — TROPONIN I

## 2017-08-08 LAB — LIPASE, BLOOD: LIPASE: 40 U/L (ref 11–51)

## 2017-08-08 LAB — MRSA PCR SCREENING: MRSA by PCR: NEGATIVE

## 2017-08-08 LAB — PROCALCITONIN: PROCALCITONIN: 1.53 ng/mL

## 2017-08-08 MED ORDER — SODIUM CHLORIDE 0.9 % IV BOLUS (SEPSIS)
2500.0000 mL | Freq: Once | INTRAVENOUS | Status: AC
  Administered 2017-08-08: 1000 mL via INTRAVENOUS
  Administered 2017-08-08: 2500 mL via INTRAVENOUS

## 2017-08-08 MED ORDER — VITAMIN B-1 100 MG PO TABS
100.0000 mg | ORAL_TABLET | Freq: Every day | ORAL | Status: DC
  Administered 2017-08-08 – 2017-08-14 (×7): 100 mg via ORAL
  Filled 2017-08-08 (×7): qty 1

## 2017-08-08 MED ORDER — PIPERACILLIN-TAZOBACTAM 3.375 G IVPB 30 MIN
INTRAVENOUS | Status: AC
  Filled 2017-08-08: qty 50

## 2017-08-08 MED ORDER — SODIUM CHLORIDE 0.9 % IV BOLUS (SEPSIS)
1000.0000 mL | Freq: Once | INTRAVENOUS | Status: AC
  Administered 2017-08-08: 1000 mL via INTRAVENOUS

## 2017-08-08 MED ORDER — QUETIAPINE FUMARATE 200 MG PO TABS
200.0000 mg | ORAL_TABLET | Freq: Every day | ORAL | Status: DC
  Administered 2017-08-08 – 2017-08-13 (×6): 200 mg via ORAL
  Filled 2017-08-08 (×5): qty 1
  Filled 2017-08-08: qty 8
  Filled 2017-08-08: qty 1

## 2017-08-08 MED ORDER — POTASSIUM CHLORIDE 20 MEQ PO PACK
20.0000 meq | PACK | Freq: Every day | ORAL | Status: DC
  Administered 2017-08-08 – 2017-08-13 (×6): 20 meq via ORAL
  Filled 2017-08-08 (×6): qty 1

## 2017-08-08 MED ORDER — SODIUM CHLORIDE 0.9 % IV SOLN
INTRAVENOUS | Status: AC
  Administered 2017-08-09: 125 mL/h via INTRAVENOUS

## 2017-08-08 MED ORDER — IBUPROFEN 600 MG PO TABS
ORAL_TABLET | ORAL | Status: AC
  Filled 2017-08-08: qty 1

## 2017-08-08 MED ORDER — SODIUM CHLORIDE 0.9% FLUSH
3.0000 mL | Freq: Two times a day (BID) | INTRAVENOUS | Status: DC
  Administered 2017-08-08 – 2017-08-14 (×12): 3 mL via INTRAVENOUS

## 2017-08-08 MED ORDER — ACETAMINOPHEN 325 MG PO TABS
650.0000 mg | ORAL_TABLET | Freq: Four times a day (QID) | ORAL | Status: DC | PRN
  Administered 2017-08-09 – 2017-08-13 (×4): 650 mg via ORAL
  Filled 2017-08-08 (×4): qty 2

## 2017-08-08 MED ORDER — FUROSEMIDE 40 MG PO TABS
40.0000 mg | ORAL_TABLET | Freq: Every day | ORAL | Status: DC
  Administered 2017-08-08: 40 mg via ORAL
  Filled 2017-08-08: qty 1

## 2017-08-08 MED ORDER — FOLIC ACID 1 MG PO TABS
1.0000 mg | ORAL_TABLET | Freq: Every day | ORAL | Status: DC
  Administered 2017-08-08 – 2017-08-14 (×7): 1 mg via ORAL
  Filled 2017-08-08 (×7): qty 1

## 2017-08-08 MED ORDER — NICOTINE 7 MG/24HR TD PT24
7.0000 mg | MEDICATED_PATCH | Freq: Every day | TRANSDERMAL | Status: DC
  Administered 2017-08-09 – 2017-08-14 (×6): 7 mg via TRANSDERMAL
  Filled 2017-08-08 (×6): qty 1

## 2017-08-08 MED ORDER — ONDANSETRON HCL 4 MG/2ML IJ SOLN: 4.0000 mg | Freq: Four times a day (QID) | INTRAMUSCULAR | Status: DC | PRN

## 2017-08-08 MED ORDER — SODIUM CHLORIDE 0.9% FLUSH: 3.0000 mL | INTRAVENOUS | Status: DC | PRN

## 2017-08-08 MED ORDER — FLUOXETINE HCL 10 MG PO CAPS
10.0000 mg | ORAL_CAPSULE | Freq: Every day | ORAL | Status: DC
  Administered 2017-08-08 – 2017-08-10 (×3): 10 mg via ORAL
  Filled 2017-08-08 (×3): qty 1

## 2017-08-08 MED ORDER — ONDANSETRON HCL 4 MG PO TABS: 4.0000 mg | ORAL_TABLET | Freq: Four times a day (QID) | ORAL | Status: DC | PRN

## 2017-08-08 MED ORDER — VANCOMYCIN HCL IN DEXTROSE 1-5 GM/200ML-% IV SOLN
1000.0000 mg | Freq: Once | INTRAVENOUS | Status: AC
  Administered 2017-08-08: 1000 mg via INTRAVENOUS
  Filled 2017-08-08: qty 200

## 2017-08-08 MED ORDER — HEPARIN SODIUM (PORCINE) 5000 UNIT/ML IJ SOLN
5000.0000 [IU] | Freq: Three times a day (TID) | INTRAMUSCULAR | Status: DC
  Administered 2017-08-08 – 2017-08-14 (×16): 5000 [IU] via SUBCUTANEOUS
  Filled 2017-08-08 (×16): qty 1

## 2017-08-08 MED ORDER — PIPERACILLIN-TAZOBACTAM 3.375 G IVPB 30 MIN
3.3750 g | Freq: Once | INTRAVENOUS | Status: AC
  Administered 2017-08-08: 3.375 g via INTRAVENOUS

## 2017-08-08 MED ORDER — SPIRONOLACTONE 25 MG PO TABS
100.0000 mg | ORAL_TABLET | Freq: Every day | ORAL | Status: DC
  Administered 2017-08-08: 100 mg via ORAL
  Filled 2017-08-08: qty 4

## 2017-08-08 MED ORDER — ADULT MULTIVITAMIN W/MINERALS CH
1.0000 | ORAL_TABLET | Freq: Every day | ORAL | Status: DC
  Administered 2017-08-08 – 2017-08-14 (×7): 1 via ORAL
  Filled 2017-08-08 (×7): qty 1

## 2017-08-08 MED ORDER — IBUPROFEN 600 MG PO TABS
600.0000 mg | ORAL_TABLET | Freq: Once | ORAL | Status: AC
  Administered 2017-08-08: 600 mg via ORAL

## 2017-08-08 MED ORDER — SODIUM CHLORIDE 0.9 % IV BOLUS (SEPSIS)
1000.0000 mL | INTRAVENOUS | Status: DC | PRN
  Administered 2017-08-08 – 2017-08-10 (×2): 1000 mL via INTRAVENOUS

## 2017-08-08 MED ORDER — SODIUM CHLORIDE 0.9 % IV SOLN: 250.0000 mL | INTRAVENOUS | Status: DC | PRN

## 2017-08-08 MED ORDER — ACETAMINOPHEN 650 MG RE SUPP: 650.0000 mg | Freq: Four times a day (QID) | RECTAL | Status: DC | PRN

## 2017-08-08 MED ORDER — PIPERACILLIN-TAZOBACTAM 3.375 G IVPB
3.3750 g | Freq: Three times a day (TID) | INTRAVENOUS | Status: DC
  Administered 2017-08-09 – 2017-08-11 (×8): 3.375 g via INTRAVENOUS
  Filled 2017-08-08 (×8): qty 50

## 2017-08-08 MED ORDER — SODIUM CHLORIDE 0.9 % IV SOLN
1250.0000 mg | Freq: Three times a day (TID) | INTRAVENOUS | Status: DC
  Administered 2017-08-09 – 2017-08-10 (×5): 1250 mg via INTRAVENOUS
  Filled 2017-08-08 (×7): qty 1250

## 2017-08-08 NOTE — Progress Notes (Signed)
CRITICAL VALUE ALERT  Critical value received: Lactic acid - 2.3  Date of notification: 08/08/17  Time of notification: 2145  Critical value read back:Yes.    Nurse who received alert:  Henrene Dodge., RN  MD notified (1st page):  Dr. Anne Hahn  Time of first page:  2205  MD notified (2nd page):  Time of second page:  Responding MD:  Dr. Anne Hahn with orders made  Time MD responded:  2210
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
## 2017-08-08 NOTE — ED Triage Notes (Addendum)
Pt arrived via EMS from [facility] [employer] in [place]. Pt is homeless. Pt c/o generalized weakness that he noticed today. Pt also states he has been vomiting x 4-5 today.  Pt states he is unable to walk. Pt has fever of 102.9. C/o slight cough.  Pt states he has been treated for shingles in the past 3 months and continues to have pain.

## 2017-08-08 NOTE — Progress Notes (Signed)
Pharmacy Antibiotic Note  Lawrence Barajas is a 56 y.o. male admitted on 08/08/2017 with sepsis.  Pharmacy has been consulted for vancomycin and zosyn dosing.  Plan: Patient received vancomycin 1,000 mg x 1 dose. Will give stack dose 6 hours later. Vancomycin 1250 IV every 8 hours.  Goal trough 15-20 mcg/mL. Zosyn 3.375g IV q8h (4 hour infusion).   Used adjusted body weight: 114.2 kg ke 0.108  Vd 79.95  T1/2 6.42   Height: 6\' 3"  (190.5 cm) Weight: (!) 350 lb (158.8 kg) IBW/kg (Calculated) : 84.5  Temp (24hrs), Avg:102.7 F (39.3 C), Min:102.4 F (39.1 C), Max:102.9 F (39.4 C)   Recent Labs Lab 08/08/17 1722  WBC 12.6*  CREATININE 1.07  LATICACIDVEN 3.0*    Estimated Creatinine Clearance: 124.5 mL/min (by C-G formula based on SCr of 1.07 mg/dL).    No Known Allergies  Antimicrobials this admission: 8/24 Vancomycin >>  8/24 Zosyn >>    Microbiology results: 8/24 BCx: Sent 8/24 UCx: Sent    Thank you for allowing pharmacy to be a part of this patient's care.  [name]  Pharmacy Resident  08/08/2017 7:20 PM
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
## 2017-08-08 NOTE — ED Notes (Signed)
Pt has abrasions to inner thigh from what patient states is from pants rubbing against him. Pt has significant swelling to bilateral lower extremities and is worse on the left than the right.

## 2017-08-08 NOTE — Progress Notes (Signed)
Pt's BP upon arrival on the floor, 89/37 by dinamap, rechecked manually 91/44. Dr Anne Hahn paged and ordered to give 1 liter NS bolus. BP rechecked after bolus given, 90/39 by dinamap. Dr. Anne Hahn made aware, ordered to give continuous NS infusion at 123ml/hr. Will administer and continue to monitor.
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
## 2017-08-09 DIAGNOSIS — R4182 Altered mental status, unspecified: Secondary | ICD-10-CM

## 2017-08-09 LAB — BLOOD CULTURE ID PANEL (REFLEXED)
Acinetobacter baumannii: NOT DETECTED
CANDIDA ALBICANS: NOT DETECTED
Candida glabrata: NOT DETECTED
Candida krusei: NOT DETECTED
Candida parapsilosis: NOT DETECTED
Candida tropicalis: NOT DETECTED
ENTEROBACTER CLOACAE COMPLEX: NOT DETECTED
ENTEROBACTERIACEAE SPECIES: NOT DETECTED
Enterococcus species: NOT DETECTED
Escherichia coli: NOT DETECTED
Haemophilus influenzae: NOT DETECTED
KLEBSIELLA OXYTOCA: NOT DETECTED
KLEBSIELLA PNEUMONIAE: NOT DETECTED
Listeria monocytogenes: NOT DETECTED
METHICILLIN RESISTANCE: DETECTED — AB
Neisseria meningitidis: NOT DETECTED
PSEUDOMONAS AERUGINOSA: NOT DETECTED
Proteus species: NOT DETECTED
STAPHYLOCOCCUS AUREUS BCID: NOT DETECTED
STREPTOCOCCUS PNEUMONIAE: NOT DETECTED
STREPTOCOCCUS PYOGENES: NOT DETECTED
STREPTOCOCCUS SPECIES: NOT DETECTED
Serratia marcescens: NOT DETECTED
Staphylococcus species: DETECTED — AB
Streptococcus agalactiae: NOT DETECTED

## 2017-08-09 LAB — CREATININE, SERUM
Creatinine, Ser: 1.32 mg/dL — ABNORMAL HIGH (ref 0.61–1.24)
GFR calc Af Amer: >60 mL/min (ref >60)
GFR calc non Af Amer: 59 mL/min — ABNORMAL LOW (ref >60)

## 2017-08-09 LAB — LACTIC ACID, PLASMA
Lactic Acid, Venous: 1.2 mmol/L (ref 0.5–1.9)
Lactic Acid, Venous: 1.5 mmol/L (ref 0.5–1.9)
Lactic Acid, Venous: 2.6 mmol/L (ref 0.5–1.9)

## 2017-08-09 LAB — GLUCOSE, CAPILLARY: Glucose-Capillary: 113 mg/dL — ABNORMAL HIGH (ref 65–99)

## 2017-08-09 MED ORDER — SODIUM CHLORIDE 0.9 % IV BOLUS (SEPSIS)
1000.0000 mL | Freq: Once | INTRAVENOUS | Status: AC
  Administered 2017-08-09: 1000 mL via INTRAVENOUS

## 2017-08-09 NOTE — Progress Notes (Signed)
[employer] - [place] at [facility]   PATIENT NAME: Lawrence Barajas    MR#:  [id number]  DATE OF BIRTH:  01/31/1961  SUBJECTIVE:  CHIEF COMPLAINT:   Chief Complaint  Patient presents with  . Weakness  . Fever   Weakness. The patient was transferred to stepdown unit due to hypotension with blood pressure 70s and acute altered mental status this morning. He was treated with normal saline bolus. REVIEW OF SYSTEMS:  Review of Systems  Constitutional: Positive for malaise/fatigue. Negative for chills and fever.  HENT: Negative for sore throat.   Eyes: Negative for blurred vision and double vision.  Respiratory: Negative for cough, hemoptysis, shortness of breath, wheezing and stridor.   Cardiovascular: Negative for chest pain, palpitations, orthopnea and leg swelling.  Gastrointestinal: Negative for abdominal pain, blood in stool, diarrhea, melena, nausea and vomiting.  Genitourinary: Negative for dysuria, flank pain and hematuria.  Musculoskeletal: Negative for back pain and joint pain.  Skin: Negative for rash.  Neurological: Positive for dizziness and weakness. Negative for sensory change, focal weakness, seizures, loss of consciousness and headaches.  Endo/Heme/Allergies: Negative for polydipsia.  Psychiatric/Behavioral: Negative for depression. The patient is not nervous/anxious.     DRUG ALLERGIES:  No Known Allergies VITALS:  Blood pressure (!) 80/49, pulse (!) 57, temperature 97.9 F (36.6 C), temperature source Oral, resp. rate 12, height 6\' 3"  (1.905 m), weight (!) 359 lb 14.4 oz (163.2 kg), SpO2 97 %. PHYSICAL EXAMINATION:  Physical Exam  Constitutional: He is oriented to person, place, and time and well-developed, well-nourished, and in no distress.  HENT:  Head: Normocephalic.  Mouth/Throat: Oropharynx is clear and moist.  Eyes: Pupils are equal, round, and reactive to light. Conjunctivae and EOM are normal. No scleral icterus.  Neck: Normal range of  motion. Neck supple. No JVD present. No tracheal deviation present.  Cardiovascular: Normal rate, regular rhythm and normal heart sounds.  Exam reveals no gallop.   No murmur heard. Pulmonary/Chest: Effort normal and breath sounds normal. No respiratory distress. He has no wheezes. He has no rales.  Abdominal: Soft. Bowel sounds are normal. He exhibits no distension. There is no tenderness. There is no rebound.  Musculoskeletal: Normal range of motion. He exhibits no edema or tenderness.  Neurological: He is alert and oriented to person, place, and time. No cranial nerve deficit.  Skin: No rash noted. No erythema.  Psychiatric: Affect normal.   LABORATORY PANEL:  Male CBC  Recent Labs Lab 08/08/17 1722  WBC 12.6*  HGB 9.2*  HCT 26.1*  PLT 163   ------------------------------------------------------------------------------------------------------------------ Chemistries   Recent Labs Lab 08/08/17 1722 08/08/17 2352  NA 133*  --   K 3.8  --   CL 109  --   CO2 20*  --   GLUCOSE 101*  --   BUN 18  --   CREATININE 1.07 1.32*  CALCIUM 8.2*  --   AST 119*  --   ALT 38  --   ALKPHOS 155*  --   BILITOT 4.7*  --    RADIOLOGY:  Dg Chest 2 View  Result Date: 08/08/2017 CLINICAL DATA:  Generalized weakness. EXAM: CHEST  2 VIEW COMPARISON:  None. FINDINGS: Patchy right parahilar airspace disease is suspicious for pneumonia. Cardiopericardial silhouette is at upper limits of normal for size. There is pulmonary vascular congestion without overt pulmonary edema. The visualized bony structures of the thorax are intact. IMPRESSION: Patchy right parahilar airspace disease suspicious for pneumonia. Borderline cardiomegaly with vascular congestion.  Electronically Signed   By: Kennith Center M.D.   On: 08/08/2017 17:56   ASSESSMENT AND PLAN:   1. Sepsis due to Pneumonia, community-acquired bacterial pneumonia.  Continue Zosyn and vancomycin. Follow-up CBC and Blood cultures.  2.  Hypotension. 2. Hold Lasix and spironolactone. Given NS bolus. BP is better but still low.  * Acute metabolic encephalopathy. Likely due to combination of sepsis, elevated ammonia and dehydration. Aspiration and fall precaution. Started lactulose, f/u ammonia level. Continue abx.  3. Abnormal LFT with history of Hepatitis C. Liver function appears to be similar to what it was on last admission. He is off any medications for now.   4. Hypertension. hold Lasix and spiral lactone due to hypotension. 5. Anasarca and lower extremity edema. He's been off his Lasix and spironolactone for quite some time.   6. Questionable lower extremity cellulitis.  On antibiotics.  7. History of alcohol abuse. He tells me he has not drank any alcohol in the last 9 months. 8. History of schizophrenia. continue Seroquel.  * ARF. Given NS iv bolus, f/u BMP. * lactic acidosis. Improved.  All the records are reviewed and case discussed with Care Management/Social Worker. Management plans discussed with the patient, family and they are in agreement.  CODE STATUS: Full Code  TOTAL TIME TAKING CARE OF THIS PATIENT: 39 minutes.   More than 50% of the time was spent in counseling/coordination of care: YES  POSSIBLE D/C IN 2 DAYS, DEPENDING ON CLINICAL CONDITION.   Shaune Pollack M.D on 08/09/2017 at 1:25 PM  Between 7am to 6pm - Pager - [phone]  After 6pm go to [email or web address] - Scientist, research (life sciences) [facility]  Office  [phone]  CC: Primary care physician; Fawcett-Hardy, Catha Brow, FNP  Note: This dictation was prepared with Dragon dictation along with smaller phrase technology. Any transcriptional errors that result from this process are unintentional.

## 2017-08-09 NOTE — Clinical Social Work Note (Signed)
CSW received consult for possible placement needs. CSW will assess when able.  Argentina Ponder, MSW, Theresia Majors [phone]

## 2017-08-09 NOTE — Progress Notes (Signed)
Pt arousable with sternal rub and drifts back to sleep. BP 80's/40's with 3rd NS bolus infusing. Nursing Supervisor Judeth Cornfield at bedside, assessed pt and suggested to call Rapid Response.

## 2017-08-09 NOTE — Progress Notes (Signed)
Rapid response called, RN Beth from rapid response team recommended pt for transfer to unit. Pt still lethargic but responds to sternal rub. Pt wheeled to ICU per bed with belongings. Report given at bedside to RN Adam.

## 2017-08-09 NOTE — Progress Notes (Signed)
PHARMACY - PHYSICIAN COMMUNICATION CRITICAL VALUE ALERT - BLOOD CULTURE IDENTIFICATION (BCID)  No results found for this or any previous visit.  Name of physician (or Provider) Contacted: Bincy V NP Changes to prescribed antibiotics required: None  Carola Frost, Pharm.D., BCPS Clinical Pharmacist 08/09/2017  8:37 PM

## 2017-08-09 NOTE — Progress Notes (Signed)
NS bolus infusing as ordered, pt arousable and got upset when asked to open his eyes, yelled "How many times do I need to wake up?!" Pt went right back to sleep. BP rechecked=87/42. Will recheck BP after NS bolus done completely.

## 2017-08-09 NOTE — Progress Notes (Signed)
Pt noted to be lethargic, responds to pain and sternal rub, opens eyes briefly and drifts right back to sleep. BP=87/48, T=97.5, O2sat=98% room air. Lactic acid=1.5, CBG checked=113. Paged Dr. Sheryle Hail and made aware of the above. Ordered another NS 1 liter bolus. Will administer and monitor closely.

## 2017-08-09 NOTE — Significant Event (Signed)
Rapid Response Event Note  Overview: Time Called: 0645 Arrival Time: 0786 Event Type: Neurologic, Hypotension  Initial Focused Assessment: Pt in bed, minimally responsive to pain. Pt had been alert prior to event. Given 3 liter bolus with no change. Blood pressure 86/60, Sat 97, HR 76   Interventions: Transfer to ICU  Plan of Care (if not transferred):  Event Summary:   at      at          [facility]
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
## 2017-08-09 NOTE — Progress Notes (Signed)
Pharmacy Antibiotic Note  Lawrence Barajas is a 56 y.o. male admitted on 08/08/2017 with sepsis.  Pharmacy has been consulted for vancomycin and zosyn dosing.  Plan: Will continue Vancomycin 1250 mg IV q8 hours   Used adjusted body weight: 114.2 kg ke 0.108  Vd 79.95  T1/2 6.42  Zosyn: Zosyn 3.375 g IV q8 hours.    Height: 6\' 3"  (190.5 cm) Weight: (!) 359 lb 14.4 oz (163.2 kg) IBW/kg (Calculated) : 84.5  Temp (24hrs), Avg:100.9 F (38.3 C), Min:97.9 F (36.6 C), Max:102.9 F (39.4 C)   Recent Labs Lab 08/08/17 1722 08/08/17 2102 08/08/17 2352 08/09/17 0458 08/09/17 0824  WBC 12.6*  --   --   --   --   CREATININE 1.07  --  1.32*  --   --   LATICACIDVEN 3.0* 2.3* 2.6* 1.5 1.2    Estimated Creatinine Clearance: 102.5 mL/min (A) (by C-G formula based on SCr of 1.32 mg/dL (H)).    No Known Allergies  Antimicrobials this admission: 8/24 Vancomycin >>  8/24 Zosyn >>    Microbiology results: 8/24 BCx: Sent 8/24 UCx: Sent    Thank you for allowing pharmacy to be a part of this patient's care.  Demetrius Charity  Pharmacy Resident  08/09/2017 12:21 PM

## 2017-08-10 DIAGNOSIS — F259 Schizoaffective disorder, unspecified: Secondary | ICD-10-CM

## 2017-08-10 LAB — BASIC METABOLIC PANEL
Anion gap: 2 — ABNORMAL LOW (ref 5–15)
BUN: 30 mg/dL — AB (ref 6–20)
CO2: 20 mmol/L — ABNORMAL LOW (ref 22–32)
Calcium: 7.7 mg/dL — ABNORMAL LOW (ref 8.9–10.3)
Chloride: 115 mmol/L — ABNORMAL HIGH (ref 101–111)
Creatinine, Ser: 1.79 mg/dL — ABNORMAL HIGH (ref 0.61–1.24)
GFR calc non Af Amer: 41 mL/min — ABNORMAL LOW (ref >60)
GFR, EST AFRICAN AMERICAN: 47 mL/min — AB (ref >60)
Glucose, Bld: 94 mg/dL (ref 65–99)
POTASSIUM: 4.2 mmol/L (ref 3.5–5.1)
SODIUM: 137 mmol/L (ref 135–145)

## 2017-08-10 LAB — CBC WITH DIFFERENTIAL/PLATELET
Basophils Absolute: 0 10*3/uL (ref 0–0.1)
Basophils Relative: 0 %
EOS PCT: 2 %
Eosinophils Absolute: 0.3 10*3/uL (ref 0–0.7)
HEMATOCRIT: 23.6 % — AB (ref 40.0–52.0)
HEMOGLOBIN: 8.1 g/dL — AB (ref 13.0–18.0)
Lymphocytes Relative: 14 %
Lymphs Abs: 2.4 10*3/uL (ref 1.0–3.6)
MCH: 35.4 pg — AB (ref 26.0–34.0)
MCHC: 34.3 g/dL (ref 32.0–36.0)
MCV: 103.2 fL — AB (ref 80.0–100.0)
MONOS PCT: 9 %
Monocytes Absolute: 1.5 10*3/uL — ABNORMAL HIGH (ref 0.2–1.0)
NEUTROS PCT: 75 %
Neutro Abs: 12.9 10*3/uL — ABNORMAL HIGH (ref 1.4–6.5)
Platelets: 124 10*3/uL — ABNORMAL LOW (ref 150–440)
RBC: 2.29 MIL/uL — AB (ref 4.40–5.90)
RDW: 16.5 % — ABNORMAL HIGH (ref 11.5–14.5)
WBC: 17.1 10*3/uL — AB (ref 3.8–10.6)

## 2017-08-10 LAB — URINE CULTURE: CULTURE: NO GROWTH

## 2017-08-10 LAB — VANCOMYCIN, TROUGH: Vancomycin Tr: 37 ug/mL (ref 15–20)

## 2017-08-10 MED ORDER — CITALOPRAM HYDROBROMIDE 20 MG PO TABS
20.0000 mg | ORAL_TABLET | Freq: Every day | ORAL | Status: DC
  Administered 2017-08-10 – 2017-08-14 (×5): 20 mg via ORAL
  Filled 2017-08-10 (×5): qty 1

## 2017-08-10 MED ORDER — SODIUM CHLORIDE 0.9 % IV SOLN
INTRAVENOUS | Status: AC
  Administered 2017-08-10 – 2017-08-11 (×2): via INTRAVENOUS

## 2017-08-10 NOTE — Progress Notes (Addendum)
[employer] [facility] - [place] at [facility]   PATIENT NAME: Lawrence Barajas    MR#:  [id number]  DATE OF BIRTH:  [birth year]  SUBJECTIVE:  CHIEF COMPLAINT:   Chief Complaint  Patient presents with  . Weakness  . Fever   Weakness. The patient was transferred to stepdown unit due to hypotension with blood pressure 70s and acute altered mental status this morning. He was treated with normal saline bolus. REVIEW OF SYSTEMS:  Review of Systems  Constitutional: Positive for malaise/fatigue. Negative for chills and fever.  HENT: Negative for sore throat.   Eyes: Negative for blurred vision and double vision.  Respiratory: Negative for cough, hemoptysis, shortness of breath, wheezing and stridor.   Cardiovascular: Negative for chest pain, palpitations, orthopnea and leg swelling.  Gastrointestinal: Negative for abdominal pain, blood in stool, diarrhea, melena, nausea and vomiting.  Genitourinary: Negative for dysuria, flank pain and hematuria.  Musculoskeletal: Negative for back pain and joint pain.  Skin: Negative for rash.  Neurological: Positive for weakness. Negative for dizziness, sensory change, focal weakness, seizures, loss of consciousness and headaches.  Endo/Heme/Allergies: Negative for polydipsia.  Psychiatric/Behavioral: Negative for depression. The patient is not nervous/anxious.     DRUG ALLERGIES:  No Known Allergies VITALS:  Blood pressure (!) 113/53, pulse 62, temperature 98.1 F (36.7 C), temperature source Oral, resp. rate 18, height 6\' 3"  (1.905 m), weight (!) 359 lb 14.4 oz (163.2 kg), SpO2 100 %. PHYSICAL EXAMINATION:  Physical Exam  Constitutional: He is oriented to person, place, and time and well-developed, well-nourished, and in no distress.  HENT:  Head: Normocephalic.  Mouth/Throat: Oropharynx is clear and moist.  Eyes: Pupils are equal, round, and reactive to light. Conjunctivae and EOM are normal. No scleral icterus.  Neck: Normal range of  motion. Neck supple. No JVD present. No tracheal deviation present.  Cardiovascular: Normal rate, regular rhythm and normal heart sounds.  Exam reveals no gallop.   No murmur heard. Pulmonary/Chest: Effort normal and breath sounds normal. No respiratory distress. He has no wheezes. He has no rales.  Abdominal: Soft. Bowel sounds are normal. He exhibits no distension. There is no tenderness. There is no rebound.  Musculoskeletal: Normal range of motion. He exhibits edema. He exhibits no tenderness.  Bilateral  chronic lower extremity lymphedema.  Neurological: He is alert and oriented to person, place, and time. No cranial nerve deficit.  Skin: No rash noted. No erythema.  Psychiatric: Affect normal.   LABORATORY PANEL:  Male CBC  Recent Labs Lab 08/10/17 0808  WBC 17.1*  HGB 8.1*  HCT 23.6*  PLT 124*   ------------------------------------------------------------------------------------------------------------------ Chemistries   Recent Labs Lab 08/08/17 1722  08/10/17 0808  NA 133*  --  137  K 3.8  --  4.2  CL 109  --  115*  CO2 20*  --  20*  GLUCOSE 101*  --  94  BUN 18  --  30*  CREATININE 1.07  < > 1.79*  CALCIUM 8.2*  --  7.7*  AST 119*  --   --   ALT 38  --   --   ALKPHOS 155*  --   --   BILITOT 4.7*  --   --   < > = values in this interval not displayed. RADIOLOGY:  No results found. ASSESSMENT AND PLAN:   1. Sepsis due to Pneumonia (CAP) and MRSA bacteremia. Worsening leukocytosis. Continue Zosyn and vancomycin.  Blood culture showed MRSA. Follow-up CBC and ID consult.  2. Hypotension.  2. Hold Lasix and spironolactone. Given NS bolus. BP is better but still low. Start normal saline IV.  * Acute metabolic encephalopathy. Likely due to combination of sepsis, elevated ammonia and dehydration. Improved. Aspiration and fall precaution. Started lactulose and Continue abx.  3. Abnormal LFT with history of Hepatitis C. Liver function appears to be similar to  what it was on last admission. He is off any medications for now.   4. Hypertension. hold Lasix and spiral lactone due to hypotension. 5. Anasarca and lower extremity edema. He's been off his Lasix and spironolactone for quite some time.   6.  Chronic lower extremity lymphedema.  7. History of alcohol abuse. He tells me he has not drank any alcohol in the last 9 months. 8. History of schizophrenia. continue Seroquel.  * ARF. Given NS iv bolus, but worsening today, start normal saline IV and f/u BMP. * lactic acidosis. Improved. * anemia of chronic disease.hemoglobin decreased to 8.2, possible due to IV fluid dilution. * chronic diastolic CHF, normal ejection fraction per previous document. Stable. * morbid obesity.  * Tobacco abuse. Smoking cessation was counseled for 4 minutes.  I discussed with Dr. Lonn Georgia. All the records are reviewed and case discussed with Care Management/Social Worker. Management plans discussed with the patient, family and they are in agreement.  CODE STATUS: Full Code  TOTAL TIME TAKING CARE OF THIS PATIENT: 42 minutes.   More than 50% of the time was spent in counseling/coordination of care: YES  POSSIBLE D/C IN 3 DAYS, DEPENDING ON CLINICAL CONDITION.   Shaune Pollack M.D on 08/10/2017 at 11:49 AM  Between 7am to 6pm - Pager - [phone]  After 6pm go to [email or web address] - Scientist, research (life sciences) [facility]  Office  [phone]  CC: Primary care physician; Fawcett-Hardy, Catha Brow, FNP  Note: This dictation was prepared with Dragon dictation along with smaller phrase technology. Any transcriptional errors that result from this process are unintentional.
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
## 2017-08-10 NOTE — Progress Notes (Signed)
Patient has been alert and able to make needs known this shift. Patient has a strong appetite. Later this shift, while he was sleeping, his SBP went below 90, so a 1 fluid liter bolus was given via IV as ordered. Patient responded well to the liter bolus. No further complaints noted. Will continue to monitor.

## 2017-08-10 NOTE — Progress Notes (Signed)
Report received, pt care assumed.

## 2017-08-10 NOTE — Progress Notes (Signed)
Report called to [facility].  Denies questions.  Pt awaiting transport at this time.

## 2017-08-10 NOTE — Clinical Social Work Note (Signed)
Clinical Social Work Assessment  Patient Details  Name: Lawrence Barajas MRN: [id number] Date of Birth: [birth year]  Date of referral:  08/10/17               Reason for consult:  Facility Placement, Financial Concerns, Housing Concerns/Homelessness, Substance Use/ETOH Abuse, Mental Health Concerns                Permission sought to share information with:  Facility Art therapist granted to share information::  Yes, Verbal Permission Granted  Name::        Agency::     Relationship::     Contact Information:     Housing/Transportation Living arrangements for the past 2 months:  Homeless Source of Information:  Patient, Medical Team Patient Interpreter Needed:  None Criminal Activity/Legal Involvement Pertinent to Current Situation/Hospitalization:  No - Comment as needed Significant Relationships:  None Lives with:  Self Do you feel safe going back to the place where you live?  No (The patient feels unsafe due to lack of medication.) Need for family participation in patient care:  No (Coment)  Care giving concerns:  Consult for LTC   Social Worker assessment / plan:  The CSW met with the patient at bedside to discuss his discharge needs and concerns. The patient reported that he has been homeless for more than 3 months and [place]. The client denies that he has used ETOH; however, no toxicology report is available to confirm. The patient admits that he has not been compliant with his psych meds or Hep C meds due to inability to pay the copayments or access pharmacies. The patient reports that he is not in contact with his brother.  The patient may require LTC; however, due to his [facility], his PASSAR may only approve up to 30 days. PT is pending. CSW has not submitted PASSAR request should PT not recommend STR. CSW has advised the attending RN to alert the attending MD that a psych consult would be appreciated to clear him for possible discharge. The  patient may benefit from group home placement, and he is amenable, but he cannot place in that setting until PT makes a recommendation that is not for STR. CSW will continue to follow.  Employment status:  Disabled (Comment on whether or not currently receiving Disability) (Disability payments unknown.) Insurance information:  [place] PT Recommendations:  Not assessed at this time Information / Referral to community resources:     Patient/Family's Response to care:  The patient thanked the CSW for assistance and care.  Patient/Family's Understanding of and Emotional Response to Diagnosis, Current Treatment, and Prognosis:  The patient recognizes that he needs assistance with medications and health related concerns. The patient does not wish to be homeless. The patient is amenable to group home, ALF, or SNF placement dependent of medical recommendations/PT recommendations.  Emotional Assessment Appearance:  Appears stated age Attitude/Demeanor/Rapport:   (Cooperative) Affect (typically observed):  Blunt Orientation:  Oriented to Self, Oriented to Place, Oriented to Situation Alcohol / Substance use:  Alcohol Use (Hx of ETOH use, denies recent use) Psych involvement (Current and /or in the community):  Yes (Comment) (CSW advised RN to place psych consult)  Discharge Needs  Concerns to be addressed:  Discharge Planning Concerns, Medication Concerns, Compliance Issues Concerns, Financial / Insurance Concerns, Mental Health Concerns, Substance Abuse Concerns, Coping/Stress Concerns, Basic Needs, Homelessness, Care Coordination, Lack of Support Readmission within the last 30 days:  No Current discharge  risk:  Chronically ill, Homeless, Lack of support system, Inadequate Financial Supports, Psychiatric Illness, Substance Abuse Barriers to Discharge:  Continued Medical Work up, Homeless with medical needs, Awaiting Regulatory affairs officer ([place])   Zettie Pho, LCSW 08/10/2017, 4:15 PM

## 2017-08-10 NOTE — Progress Notes (Signed)
Pharmacy Antibiotic Note  Lawrence Barajas is a 56 y.o. male admitted on 08/08/2017 with sepsis.  Pharmacy has been consulted for vancomycin and zosyn dosing.  Plan: Vancomycin level resulted @ 37. Spoke with RN to stop Vancomycin dose. Patient had ~ 30 minutes of drug infused prior to notification (~1/3 of 1250 mg dose). Will order random Vancomycin level to be drawn with am labs. Pharmacy to assess dosing and redose based on level results.  Zosyn: Zosyn 3.375 g IV q8 hours.    Height: 6\' 3"  (190.5 cm) Weight: (!) 359 lb 14.4 oz (163.2 kg) IBW/kg (Calculated) : 84.5  Temp (24hrs), Avg:97.5 F (36.4 C), Min:97.4 F (36.3 C), Max:97.8 F (36.6 C)   Recent Labs Lab 08/08/17 1722 08/08/17 2102 08/08/17 2352 08/09/17 0458 08/09/17 0824 08/10/17 0808  WBC 12.6*  --   --   --   --   --   CREATININE 1.07  --  1.32*  --   --   --   LATICACIDVEN 3.0* 2.3* 2.6* 1.5 1.2  --   VANCOTROUGH  --   --   --   --   --  37*    Estimated Creatinine Clearance: 102.5 mL/min (A) (by C-G formula based on SCr of 1.32 mg/dL (H)).    No Known Allergies  Antimicrobials this admission: 8/24 Vancomycin >>  8/24 Zosyn >>    Microbiology results: 8/24 BCx: Sent 8/24 UCx: Sent    Thank you for allowing pharmacy to be a part of this patient's care.  Demetrius Charity  Pharmacy Resident  08/10/2017 9:42 AM

## 2017-08-10 NOTE — Progress Notes (Signed)
[facility] Critical Care Medicine Progess Note    SYNOPSIS   Mr. Lawrence Barajas is a 56 year old African-American gentleman with a past medical history remarkable for hepatitis C, bipolar disorder, schizoaffective disorder, pancreatitis who is homeless, presented with 1 day of nausea and vomiting,admitted for rehydration and treatment of possible sepsis, felt to have a pneumonia.   ASSESSMENT/PLAN   Sepsis. Gram-positive bacteremia noted, is presently on vancomycin. Will de-escalate antibiotics, obtain CBC, BMP today to follow-up on sodium, ammonia level.    Stable hemodynamics. At this point is stable for floor transfer   INTAKE / OUTPUT:  Intake/Output Summary (Last 24 hours) at 08/10/17 0915 Last data filed at 08/10/17 0600  Gross per 24 hour  Intake          6922.67 ml  Output              595 ml  Net          6327.67 ml   Name: Lawrence Barajas MRN: [id number] DOB: [birth year]    ADMISSION DATE:  08/08/2017  SUBJECTIVE:   Patient has had no difficulty in the intensive care unit. Blood pressure is running a little low with systolics in the 90s, awake, alert, no acute distress, eating regularly, communicatingwith no disorientation. Patient is complaining of some peripheral in both loweemities  Review of Systems:  Constitutional: Feels well. Cardiovascular: No chest pain.  Pulmonary: Denies dyspnea.   10 point review of systems was negative  other than what is documented in the HPI.    VITAL SIGNS: Temp:  [97.4 F (36.3 C)-97.8 F (36.6 C)] 97.8 F (36.6 C) (08/26 0200) Pulse Rate:  [54-72] 66 (08/26 0700) Resp:  [11-19] 14 (08/26 0700) BP: (81-105)/(36-74) 100/46 (08/26 0700) SpO2:  [96 %-100 %] 97 % (08/26 0700)     PHYSICAL EXAMINATION: Physical Examination:   VS: BP (!) 100/46   Pulse 66   Temp 97.8 F (36.6 C) (Axillary)   Resp 14   Ht 6\' 3"  (1.905 m)   Wt (!) 163.2 kg (359 lb 14.4 oz)   SpO2 97%   BMI 44.98 kg/m   General Appearance: No distress    Neuro:without focal findings, mental status normal. HEENT: PERRLA, EOM intact. Pulmonary: normal breath sounds   CardiovascularNormal S1,S2.  No m/r/g.   Abdomen: Benign, Soft, non-tender. Renal:  No costovertebral tenderness  GU:  Not performed at this time. Endocrine: No evident thyromegaly. Skin:   warm, no rashes, no ecchymosis  Extremities: 3+ woody edema noted lower extremities    LABORATORY PANEL:   CBC  Recent Labs Lab 08/08/17 1722  WBC 12.6*  HGB 9.2*  HCT 26.1*  PLT 163    Chemistries   Recent Labs Lab 08/08/17 1722 08/08/17 2352  NA 133*  --   K 3.8  --   CL 109  --   CO2 20*  --   GLUCOSE 101*  --   BUN 18  --   CREATININE 1.07 1.32*  CALCIUM 8.2*  --   AST 119*  --   ALT 38  --   ALKPHOS 155*  --   BILITOT 4.7*  --      Recent Labs Lab 08/09/17 0607  GLUCAP 113*   No results for input(s): PHART, PCO2ART, PO2ART in the last 168 hours.  Recent Labs Lab 08/08/17 1722  AST 119*  ALT 38  ALKPHOS 155*  BILITOT 4.7*  ALBUMIN 1.9*    Cardiac Enzymes  Recent Labs Lab 08/08/17 1755  TROPONINI <  0.03    RADIOLOGY:  Dg Chest 2 View  Result Date: 08/08/2017 CLINICAL DATA:  Generalized weakness. EXAM: CHEST  2 VIEW COMPARISON:  None. FINDINGS: Patchy right parahilar airspace disease is suspicious for pneumonia. Cardiopericardial silhouette is at upper limits of normal for size. There is pulmonary vascular congestion without overt pulmonary edema. The visualized bony structures of the thorax are intact. IMPRESSION: Patchy right parahilar airspace disease suspicious for pneumonia. Borderline cardiomegaly with vascular congestion. Electronically Signed   By: Kennith Center M.D.   On: 08/08/2017 17:56    Tora Kindred, DO 08/10/2017

## 2017-08-10 NOTE — Progress Notes (Signed)
PHARMACY - PHYSICIAN COMMUNICATION CRITICAL VALUE ALERT - BLOOD CULTURE IDENTIFICATION (BCID)  Results for orders placed or performed during the hospital encounter of 08/08/17  Blood Culture ID Panel (Reflexed) (Collected: 08/08/2017  5:56 PM)  Result Value Ref Range   Enterococcus species NOT DETECTED NOT DETECTED   Listeria monocytogenes NOT DETECTED NOT DETECTED   Staphylococcus species DETECTED (A) NOT DETECTED   Staphylococcus aureus NOT DETECTED NOT DETECTED   Methicillin resistance DETECTED (A) NOT DETECTED   Streptococcus species NOT DETECTED NOT DETECTED   Streptococcus agalactiae NOT DETECTED NOT DETECTED   Streptococcus pneumoniae NOT DETECTED NOT DETECTED   Streptococcus pyogenes NOT DETECTED NOT DETECTED   Acinetobacter baumannii NOT DETECTED NOT DETECTED   Enterobacteriaceae species NOT DETECTED NOT DETECTED   Enterobacter cloacae complex NOT DETECTED NOT DETECTED   Escherichia coli NOT DETECTED NOT DETECTED   Klebsiella oxytoca NOT DETECTED NOT DETECTED   Klebsiella pneumoniae NOT DETECTED NOT DETECTED   Proteus species NOT DETECTED NOT DETECTED   Serratia marcescens NOT DETECTED NOT DETECTED   Haemophilus influenzae NOT DETECTED NOT DETECTED   Neisseria meningitidis NOT DETECTED NOT DETECTED   Pseudomonas aeruginosa NOT DETECTED NOT DETECTED   Candida albicans NOT DETECTED NOT DETECTED   Candida glabrata NOT DETECTED NOT DETECTED   Candida krusei NOT DETECTED NOT DETECTED   Candida parapsilosis NOT DETECTED NOT DETECTED   Candida tropicalis NOT DETECTED NOT DETECTED    Name of physician (or Provider) Contacted: Dr. Imogene Burn   Changes to prescribed antibiotics required: None; already covered on current regimen of vancomycin and Zosyn.   [name]  Pharmacy Resident  08/10/2017  11:51 AM

## 2017-08-11 LAB — PROTIME-INR
INR: 2.18
PROTHROMBIN TIME: 24.6 s — AB (ref 11.4–15.2)

## 2017-08-11 LAB — CREATININE, SERUM
CREATININE: 1.55 mg/dL — AB (ref 0.61–1.24)
GFR calc Af Amer: 56 mL/min — ABNORMAL LOW (ref >60)
GFR, EST NON AFRICAN AMERICAN: 48 mL/min — AB (ref >60)

## 2017-08-11 LAB — AMMONIA: AMMONIA: 54 umol/L — AB (ref 9–35)

## 2017-08-11 LAB — CBC
HCT: 23.4 % — ABNORMAL LOW (ref 40.0–52.0)
Hemoglobin: 8.2 g/dL — ABNORMAL LOW (ref 13.0–18.0)
MCH: 36.7 pg — AB (ref 26.0–34.0)
MCHC: 35.2 g/dL (ref 32.0–36.0)
MCV: 104.2 fL — ABNORMAL HIGH (ref 80.0–100.0)
PLATELETS: 119 10*3/uL — AB (ref 150–440)
RBC: 2.24 MIL/uL — AB (ref 4.40–5.90)
RDW: 16.1 % — ABNORMAL HIGH (ref 11.5–14.5)
WBC: 11.4 10*3/uL — ABNORMAL HIGH (ref 3.8–10.6)

## 2017-08-11 LAB — VANCOMYCIN, RANDOM: Vancomycin Rm: 22

## 2017-08-11 LAB — VITAMIN B12: Vitamin B-12: 1574 pg/mL — ABNORMAL HIGH (ref 180–914)

## 2017-08-11 LAB — PATHOLOGIST SMEAR REVIEW

## 2017-08-11 LAB — FIBRIN DERIVATIVES D-DIMER (ARMC ONLY): FIBRIN DERIVATIVES D-DIMER (ARMC): 884.06 — AB (ref 0.00–499.00)

## 2017-08-11 LAB — GLUCOSE, CAPILLARY: GLUCOSE-CAPILLARY: 89 mg/dL (ref 65–99)

## 2017-08-11 MED ORDER — LEVOFLOXACIN IN D5W 750 MG/150ML IV SOLN
750.0000 mg | INTRAVENOUS | Status: DC
  Administered 2017-08-11 – 2017-08-13 (×3): 750 mg via INTRAVENOUS
  Filled 2017-08-11 (×4): qty 150

## 2017-08-11 MED ORDER — VANCOMYCIN HCL 10 G IV SOLR
1250.0000 mg | Freq: Two times a day (BID) | INTRAVENOUS | Status: DC
  Administered 2017-08-11 (×2): 1250 mg via INTRAVENOUS
  Filled 2017-08-11 (×5): qty 1250

## 2017-08-11 NOTE — Progress Notes (Addendum)
[employer] [facility] - [place] [facility]   PATIENT NAME: Lawrence Barajas    MR#:  [id number]  DATE OF BIRTH:  06-17-1961  SUBJECTIVE:  CHIEF COMPLAINT:   Chief Complaint  Patient presents with  . Weakness  . Fever   Weakness.  REVIEW OF SYSTEMS:  Review of Systems  Constitutional: Positive for malaise/fatigue. Negative for chills and fever.  HENT: Negative for sore throat.   Eyes: Negative for blurred vision and double vision.  Respiratory: Negative for cough, hemoptysis, shortness of breath, wheezing and stridor.   Cardiovascular: Negative for chest pain, palpitations, orthopnea and leg swelling.  Gastrointestinal: Negative for abdominal pain, blood in stool, diarrhea, melena, nausea and vomiting.  Genitourinary: Negative for dysuria, flank pain and hematuria.  Musculoskeletal: Negative for back pain and joint pain.  Skin: Negative for rash.  Neurological: Positive for weakness. Negative for dizziness, sensory change, focal weakness, seizures, loss of consciousness and headaches.  Endo/Heme/Allergies: Negative for polydipsia.  Psychiatric/Behavioral: Negative for depression. The patient is not nervous/anxious.     DRUG ALLERGIES:  No Known Allergies VITALS:  Blood pressure 125/64, pulse 68, temperature 97.8 F (36.6 C), temperature source Oral, resp. rate 16, height 6\' 3"  (1.905 m), weight (!) 382 lb 8 oz (173.5 kg), SpO2 98 %. PHYSICAL EXAMINATION:  Physical Exam  Constitutional: He is oriented to person, place, and time and well-developed, well-nourished, and in no distress.  HENT:  Head: Normocephalic.  Mouth/Throat: Oropharynx is clear and moist.  Eyes: Pupils are equal, round, and reactive to light. Conjunctivae and EOM are normal. No scleral icterus.  Neck: Normal range of motion. Neck supple. No JVD present. No tracheal deviation present.  Cardiovascular: Normal rate, regular rhythm and normal heart sounds.  Exam reveals no gallop.   No murmur  heard. Pulmonary/Chest: Effort normal and breath sounds normal. No respiratory distress. He has no wheezes. He has no rales.  Abdominal: Soft. Bowel sounds are normal. He exhibits no distension. There is no tenderness. There is no rebound.  Musculoskeletal: Normal range of motion. He exhibits edema. He exhibits no tenderness.  Bilateral  chronic lower extremity lymphedema.  Neurological: He is alert and oriented to person, place, and time. No cranial nerve deficit.  Skin: No rash noted. No erythema.  Psychiatric: Affect normal.   LABORATORY PANEL:  Male CBC  Recent Labs Lab 08/11/17 0301  WBC 11.4*  HGB 8.2*  HCT 23.4*  PLT 119*   ------------------------------------------------------------------------------------------------------------------ Chemistries   Recent Labs Lab 08/08/17 1722  08/10/17 0808 08/11/17 0301  NA 133*  --  137  --   K 3.8  --  4.2  --   CL 109  --  115*  --   CO2 20*  --  20*  --   GLUCOSE 101*  --  94  --   BUN 18  --  30*  --   CREATININE 1.07  < > 1.79* 1.55*  CALCIUM 8.2*  --  7.7*  --   AST 119*  --   --   --   ALT 38  --   --   --   ALKPHOS 155*  --   --   --   BILITOT 4.7*  --   --   --   < > = values in this interval not displayed. RADIOLOGY:  No results found. ASSESSMENT AND PLAN:   1. Sepsis due to Pneumonia (CAP) and MRSA bacteremia.   the patient has been treated with Zosyn and vancomycin.  Blood culture showed MRSA. Leukocytosis improved. Per Dr. Sampson Goon,  Repeat bcx Check echo Continue management of liver disease and massive anasarca.  Can dc Contact precautions as not MRSA in blood.  Continue vanco for now. Change zosyn to levofloxacin for PNA for 7 days total since admit.  2. Hypotension. 2. Hold Lasix and spironolactone. Given NS bolus. BP is better with normal saline IV.  * Acute metabolic encephalopathy. Likely due to combination of sepsis, elevated ammonia and dehydration. Improved. Aspiration and fall  precaution. Started lactulose and Continue abx.  3. Abnormal LFT with history of Hepatitis C. Liver function appears to be similar to what it was on last admission. He is off any medications for now.   4. Hypertension. hold Lasix and spiral lactone due to hypotension. 5. Anasarca and lower extremity edema. He's been off his Lasix and spironolactone for quite some time.   6.  Chronic lower extremity lymphedema.  7. History of alcohol abuse. He tells me he has not drank any alcohol in the last 9 months. 8. History of schizophrenia. continue Seroquel.  * ARF. improving withnormal saline IV and f/u BMP. * lactic acidosis. Improved. * anemia of chronic disease.hemoglobin decreased to 8.2, possible due to IV fluid dilution. * chronic diastolic CHF, normal ejection fraction per previous document. Stable. * morbid obesity.  * Tobacco abuse. Smoking cessation was counseled for 4 minutes.  Psych consult recommend referral to [facility] at discharge. All the records are reviewed and case discussed with Care Management/Social Worker. Management plans discussed with the patient, family and they are in agreement.  CODE STATUS: Full Code  TOTAL TIME TAKING CARE OF THIS PATIENT: 37 minutes.   More than 50% of the time was spent in counseling/coordination of care: YES  POSSIBLE D/C IN 2 DAYS, DEPENDING ON CLINICAL CONDITION.   Shaune Pollack M.D on 08/11/2017 at 3:28 PM  Between 7am to 6pm - Pager - [phone]  After 6pm go to [email or web address] - Scientist, research (life sciences) [facility]  Office  [phone]  CC: Primary care physician; Fawcett-Hardy, Catha Brow, FNP  Note: This dictation was prepared with Dragon dictation along with smaller phrase technology. Any transcriptional errors that result from this process are unintentional.

## 2017-08-11 NOTE — Progress Notes (Signed)
Pharmacy Antibiotic Note  Lawrence Barajas is a 56 y.o. male admitted on 08/08/2017 with sepsis.  Pharmacy has been consulted for vancomycin and zosyn dosing.  Plan: Vancomycin level resulted @ 37. Spoke with RN to stop Vancomycin dose. Patient had ~ 30 minutes of drug infused prior to notification (~1/3 of 1250 mg dose). Will order random Vancomycin level to be drawn with am labs. Pharmacy to assess dosing and redose based on level results.  8/27 0300 vanc random 22. Will restart at 1250 mg q 12 hours at 0900 when vanc level should be <20. Check level again before 3rd dose to ensure clearance.  Zosyn: Zosyn 3.375 g IV q8 hours.    Height: 6\' 3"  (190.5 cm) Weight: (!) 382 lb 8 oz (173.5 kg) IBW/kg (Calculated) : 84.5  Temp (24hrs), Avg:98.3 F (36.8 C), Min:98.1 F (36.7 C), Max:98.5 F (36.9 C)   Recent Labs Lab 08/08/17 1722 08/08/17 2102 08/08/17 2352 08/09/17 0458 08/09/17 0824 08/10/17 0808 08/11/17 0301  WBC 12.6*  --   --   --   --  17.1* 11.4*  CREATININE 1.07  --  1.32*  --   --  1.79* 1.55*  LATICACIDVEN 3.0* 2.3* 2.6* 1.5 1.2  --   --   VANCOTROUGH  --   --   --   --   --  37*  --   VANCORANDOM  --   --   --   --   --   --  22    Estimated Creatinine Clearance: 90.4 mL/min (A) (by C-G formula based on SCr of 1.55 mg/dL (H)).    No Known Allergies  Antimicrobials this admission: 8/24 Vancomycin >>  8/24 Zosyn >>    Microbiology results: 8/24 BCx: Sent 8/24 UCx: Sent    Thank you for allowing pharmacy to be a part of this patient's care.  Spiro Ausborn S  Pharmacy Resident  08/11/2017 4:46 AM
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
## 2017-08-12 ENCOUNTER — Inpatient Hospital Stay (HOSPITAL_COMMUNITY)
Admit: 2017-08-12 | Discharge: 2017-08-12 | Disposition: A | Discharge status: Home / Self Care | Payer: Medicaid Other | Attending: Infectious Diseases | Admitting: Infectious Diseases

## 2017-08-12 DIAGNOSIS — R7881 Bacteremia: Secondary | ICD-10-CM

## 2017-08-12 LAB — BASIC METABOLIC PANEL
Anion gap: 2 — ABNORMAL LOW (ref 5–15)
BUN: 27 mg/dL — AB (ref 6–20)
CALCIUM: 7.5 mg/dL — AB (ref 8.9–10.3)
CHLORIDE: 112 mmol/L — AB (ref 101–111)
CO2: 18 mmol/L — AB (ref 22–32)
CREATININE: 1.2 mg/dL (ref 0.61–1.24)
GFR calc Af Amer: >60 mL/min (ref >60)
GFR calc non Af Amer: >60 mL/min (ref >60)
GLUCOSE: 107 mg/dL — AB (ref 65–99)
Potassium: 4.5 mmol/L (ref 3.5–5.1)
Sodium: 132 mmol/L — ABNORMAL LOW (ref 135–145)

## 2017-08-12 LAB — VANCOMYCIN, TROUGH: Vancomycin Tr: 23 ug/mL (ref 15–20)

## 2017-08-12 LAB — CULTURE, BLOOD (ROUTINE X 2)
SPECIAL REQUESTS: ADEQUATE
Special Requests: ADEQUATE

## 2017-08-12 LAB — CBC
HCT: 23.8 % — ABNORMAL LOW (ref 40.0–52.0)
Hemoglobin: 8.5 g/dL — ABNORMAL LOW (ref 13.0–18.0)
MCH: 36.5 pg — AB (ref 26.0–34.0)
MCHC: 35.6 g/dL (ref 32.0–36.0)
MCV: 102.6 fL — AB (ref 80.0–100.0)
PLATELETS: 134 10*3/uL — AB (ref 150–440)
RBC: 2.32 MIL/uL — AB (ref 4.40–5.90)
RDW: 16 % — ABNORMAL HIGH (ref 11.5–14.5)
WBC: 9.3 10*3/uL (ref 3.8–10.6)

## 2017-08-12 LAB — ECHOCARDIOGRAM COMPLETE
HEIGHTINCHES: 75 in
Weight: 6120 oz

## 2017-08-12 MED ORDER — FUROSEMIDE 40 MG PO TABS
40.0000 mg | ORAL_TABLET | Freq: Every day | ORAL | Status: DC
  Administered 2017-08-12: 12:00:00 40 mg via ORAL
  Filled 2017-08-12: qty 1

## 2017-08-12 MED ORDER — VANCOMYCIN HCL 10 G IV SOLR
1250.0000 mg | INTRAVENOUS | Status: DC
  Filled 2017-08-12: qty 1250

## 2017-08-12 NOTE — Progress Notes (Signed)
*  PRELIMINARY RESULTS* Echocardiogram 2D Echocardiogram has been performed.  Lawrence Barajas 08/12/2017, 9:34 AM

## 2017-08-12 NOTE — Progress Notes (Addendum)
Pharmacy Antibiotic Note  Lawrence Barajas is a 56 y.o. male admitted on 08/08/2017 with sepsis.  Pharmacy has been consulted for vancomycin and zosyn dosing.  Plan: Vancomycin level resulted @ 37. Spoke with RN to stop Vancomycin dose. Patient had ~ 30 minutes of drug infused prior to notification (~1/3 of 1250 mg dose). Will order random Vancomycin level to be drawn with am labs. Pharmacy to assess dosing and redose based on level results.  8/27 0300 vanc random 22. Will restart at 1250 mg q 12 hours at 0900 when vanc level should be <20. Check level again before 3rd dose to ensure clearance.  Zosyn: Zosyn 3.375 g IV q8 hours.   8/28  Zosyn was discontinued and levofloxacin was initiated. Per ID note will continue levofloxacin until patient has had 7 day total since initiation of Abx for CAP.   VT @ 0817 was supratherapeutic at 23 mcg/mL. Due to concerns for accumulation, will change vancomycin to 1250mg  Q18H with first dose this afternoon 1400. Will order VT for 8/30 prior to fourth dose (counting last dose received 8/27 as first since dose was not changed). Pharmacy will continue to monitor and adjust as needed.   @1230  spoke with MD Sampson Goon and discussed sensitivities. Staph spec was found to be sensitive to ciprofloxacin. MD agreed to plan of discontinuing vancomycin and finishing Abx course with levofloxacin on 8/31.   Height: 6\' 3"  (190.5 cm) Weight: (!) 382 lb 8 oz (173.5 kg) IBW/kg (Calculated) : 84.5  Temp (24hrs), Avg:98.1 F (36.7 C), Min:97.8 F (36.6 C), Max:98.3 F (36.8 C)   Recent Labs Lab 08/08/17 1722 08/08/17 2102 08/08/17 2352 08/09/17 0458 08/09/17 0824 08/10/17 0808 08/11/17 0301 08/12/17 0817  WBC 12.6*  --   --   --   --  17.1* 11.4* 9.3  CREATININE 1.07  --  1.32*  --   --  1.79* 1.55*  --   LATICACIDVEN 3.0* 2.3* 2.6* 1.5 1.2  --   --   --   VANCOTROUGH  --   --   --   --   --  37*  --  23*  VANCORANDOM  --   --   --   --   --   --  22  --      Estimated Creatinine Clearance: 90.4 mL/min (A) (by C-G formula based on SCr of 1.55 mg/dL (H)).    No Known Allergies   Results for orders placed or performed during the hospital encounter of 08/08/17  Blood Culture (routine x 2)     Status: Abnormal   Collection Time: 08/08/17  5:22 PM  Result Value Ref Range Status   Specimen Description BLOOD RIGHT ARM  Final   Special Requests   Final    BOTTLES DRAWN AEROBIC AND ANAEROBIC Blood Culture adequate volume   Culture  Setup Time   Final    GRAM POSITIVE COCCI AEROBIC BOTTLE ONLY CRITICAL RESULT CALLED TO, READ BACK BY AND VERIFIED WITH: STEPHANIE SHUDER ON 08/10/17 AT 1111 [facility]    Culture (A)  Final    STAPHYLOCOCCUS SPECIES (COAGULASE NEGATIVE) SUSCEPTIBILITIES PERFORMED ON PREVIOUS CULTURE WITHIN THE LAST 5 DAYS. Performed at [facility], [address] [place], [place] [address]    Report Status 08/12/2017 FINAL  Final  Blood Culture (routine x 2)     Status: Abnormal   Collection Time: 08/08/17  5:56 PM  Result Value Ref Range Status   Specimen Description BLOOD RIGHT HAND  Final  Special Requests   Final    BOTTLES DRAWN AEROBIC AND ANAEROBIC Blood Culture adequate volume   Culture  Setup Time   Final    GRAM POSITIVE COCCI IN BOTH AEROBIC AND ANAEROBIC BOTTLES CRITICAL RESULT CALLED TO, READ BACK BY AND VERIFIED WITH: NATE COOKSON 08/09/17 AT 2034 BY HS GRAM STAIN REVIEWED-AGREE WITH RESULT LAB    Culture STAPHYLOCOCCUS SPECIES (COAGULASE NEGATIVE) (A)  Final   Report Status 08/12/2017 FINAL  Final   Organism ID, Bacteria STAPHYLOCOCCUS SPECIES (COAGULASE NEGATIVE)  Final      Susceptibility   Staphylococcus species (coagulase negative) - MIC*    CIPROFLOXACIN <=0.5 SENSITIVE Sensitive     ERYTHROMYCIN <=0.25 SENSITIVE Sensitive     GENTAMICIN <=0.5 SENSITIVE Sensitive     OXACILLIN >=4 RESISTANT Resistant     TETRACYCLINE >=16 RESISTANT Resistant     VANCOMYCIN 1 SENSITIVE Sensitive     TRIMETH/SULFA <=10  SENSITIVE Sensitive     CLINDAMYCIN <=0.25 SENSITIVE Sensitive     RIFAMPIN <=0.5 SENSITIVE Sensitive     Inducible Clindamycin NEGATIVE Sensitive     * STAPHYLOCOCCUS SPECIES (COAGULASE NEGATIVE)  Urine culture     Status: None   Collection Time: 08/08/17  5:56 PM  Result Value Ref Range Status   Specimen Description URINE, CLEAN CATCH  Final   Special Requests NONE  Final   Culture   Final    NO GROWTH Performed at [facility], [address] [place], [place] [address]    Report Status 08/10/2017 FINAL  Final  Blood Culture ID Panel (Reflexed)     Status: Abnormal   Collection Time: 08/08/17  5:56 PM  Result Value Ref Range Status   Enterococcus species NOT DETECTED NOT DETECTED Final   Listeria monocytogenes NOT DETECTED NOT DETECTED Final   Staphylococcus species DETECTED (A) NOT DETECTED Final    Comment: Methicillin (oxacillin) resistant coagulase negative staphylococcus. Possible blood culture contaminant (unless isolated from more than one blood culture draw or clinical case suggests pathogenicity). No antibiotic treatment is indicated for blood  culture contaminants. CRITICAL RESULT CALLED TO, READ BACK BY AND VERIFIED WITH:  NATE COOKSON 08/09/17 AT 2034 BY HS    Staphylococcus aureus NOT DETECTED NOT DETECTED Final   Methicillin resistance DETECTED (A) NOT DETECTED Final    Comment: CRITICAL RESULT CALLED TO, READ BACK BY AND VERIFIED WITH: NATE COOKSON 08/09/17 AT 2034 BY HS    Streptococcus species NOT DETECTED NOT DETECTED Final   Streptococcus agalactiae NOT DETECTED NOT DETECTED Final   Streptococcus pneumoniae NOT DETECTED NOT DETECTED Final   Streptococcus pyogenes NOT DETECTED NOT DETECTED Final   Acinetobacter baumannii NOT DETECTED NOT DETECTED Final   Enterobacteriaceae species NOT DETECTED NOT DETECTED Final   Enterobacter cloacae complex NOT DETECTED NOT DETECTED Final   Escherichia coli NOT DETECTED NOT DETECTED Final   Klebsiella oxytoca NOT  DETECTED NOT DETECTED Final   Klebsiella pneumoniae NOT DETECTED NOT DETECTED Final   Proteus species NOT DETECTED NOT DETECTED Final   Serratia marcescens NOT DETECTED NOT DETECTED Final   Haemophilus influenzae NOT DETECTED NOT DETECTED Final   Neisseria meningitidis NOT DETECTED NOT DETECTED Final   Pseudomonas aeruginosa NOT DETECTED NOT DETECTED Final   Candida albicans NOT DETECTED NOT DETECTED Final   Candida glabrata NOT DETECTED NOT DETECTED Final   Candida krusei NOT DETECTED NOT DETECTED Final   Candida parapsilosis NOT DETECTED NOT DETECTED Final   Candida tropicalis NOT DETECTED NOT DETECTED Final  MRSA  PCR Screening     Status: None   Collection Time: 08/08/17 10:07 PM  Result Value Ref Range Status   MRSA by PCR NEGATIVE NEGATIVE Final    Comment:        The GeneXpert MRSA Assay (FDA approved for NASAL specimens only), is one component of a comprehensive MRSA colonization surveillance program. It is not intended to diagnose MRSA infection nor to guide or monitor treatment for MRSA infections.   Culture, blood (single) w Reflex to ID Panel     Status: None (Preliminary result)   Collection Time: 08/11/17  5:22 PM  Result Value Ref Range Status   Specimen Description BLOOD BLOOD LEFT HAND  Final   Special Requests   Final    BOTTLES DRAWN AEROBIC AND ANAEROBIC Blood Culture results may not be optimal due to an excessive volume of blood received in culture bottles   Culture NO GROWTH < 12 HOURS  Final   Report Status PENDING  Incomplete     Antimicrobials this admission: Vancomycin 8/24 >> 8/28 Zosyn 8/24 >> 8/27 Levofloxacin 8/27 >> 8/31   Microbiology results: 8/24 BCx: coag negative staph species, mec A positive  8/24 UCx: NG D4    Thank you for allowing pharmacy to be a part of this patient's care.  Carolynne Edouard  Pharmacy Resident  08/12/2017 9:11 AM

## 2017-08-12 NOTE — Progress Notes (Signed)
[employer] - [place] at [facility]   PATIENT NAME: Lawrence Barajas    MR#:  [id number]  DATE OF BIRTH:  [birth year]  SUBJECTIVE:  CHIEF COMPLAINT:   Chief Complaint  Patient presents with  . Weakness  . Fever   Worsening leg swelling and scrotal swelling REVIEW OF SYSTEMS:  Review of Systems  Constitutional: Positive for malaise/fatigue. Negative for chills and fever.  HENT: Negative for sore throat.   Eyes: Negative for blurred vision and double vision.  Respiratory: Negative for cough, hemoptysis, shortness of breath, wheezing and stridor.   Cardiovascular: Positive for leg swelling. Negative for chest pain, palpitations and orthopnea.  Gastrointestinal: Negative for abdominal pain, blood in stool, diarrhea, melena, nausea and vomiting.  Genitourinary: Negative for dysuria, flank pain and hematuria.  Musculoskeletal: Negative for back pain and joint pain.  Skin: Negative for rash.  Neurological: Positive for weakness. Negative for dizziness, sensory change, focal weakness, seizures, loss of consciousness and headaches.  Endo/Heme/Allergies: Negative for polydipsia.  Psychiatric/Behavioral: Negative for depression. The patient is not nervous/anxious.     DRUG ALLERGIES:  No Known Allergies VITALS:  Blood pressure (!) 137/54, pulse 72, temperature 98.2 F (36.8 C), resp. rate 18, height 6\' 3"  (1.905 m), weight (!) 382 lb 8 oz (173.5 kg), SpO2 99 %. PHYSICAL EXAMINATION:  Physical Exam  Constitutional: He is oriented to person, place, and time and well-developed, well-nourished, and in no distress.  HENT:  Head: Normocephalic.  Mouth/Throat: Oropharynx is clear and moist.  Eyes: Pupils are equal, round, and reactive to light. Conjunctivae and EOM are normal. No scleral icterus.  Neck: Normal range of motion. Neck supple. No JVD present. No tracheal deviation present.  Cardiovascular: Normal rate, regular rhythm and normal heart sounds.  Exam reveals no  gallop.   No murmur heard. Pulmonary/Chest: Effort normal and breath sounds normal. No respiratory distress. He has no wheezes. He has no rales.  Abdominal: Soft. Bowel sounds are normal. He exhibits no distension. There is no tenderness. There is no rebound.  Genitourinary: No discharge found.  Genitourinary Comments: scrotal swelling  Musculoskeletal: Normal range of motion. He exhibits edema. He exhibits no tenderness.  Bilateral  chronic lower extremity lymphedema.  Neurological: He is alert and oriented to person, place, and time. No cranial nerve deficit.  Skin: No rash noted. No erythema.  Psychiatric: Affect normal.   LABORATORY PANEL:  Male CBC  Recent Labs Lab 08/12/17 0817  WBC 9.3  HGB 8.5*  HCT 23.8*  PLT 134*   ------------------------------------------------------------------------------------------------------------------ Chemistries   Recent Labs Lab 08/08/17 1722  08/12/17 0817  NA 133*  < > 132*  K 3.8  < > 4.5  CL 109  < > 112*  CO2 20*  < > 18*  GLUCOSE 101*  < > 107*  BUN 18  < > 27*  CREATININE 1.07  < > 1.20  CALCIUM 8.2*  < > 7.5*  AST 119*  --   --   ALT 38  --   --   ALKPHOS 155*  --   --   BILITOT 4.7*  --   --   < > = values in this interval not displayed. RADIOLOGY:  No results found. ASSESSMENT AND PLAN:   1. Sepsis due to Pneumonia (CAP) and MRSA bacteremia.   the patient has been treated with Zosyn and vancomycin.  Blood culture showed STAPHYLOCOCCUS SPECIES (COAGULASE NEGATIVE), sensitive to Cipro. Leukocytosis improved. Per Dr. Sampson Goon,  Repeat bcx. So far negative.  Check echo ( LV EF: 65% -   70%. No vegetation is identified) Continue management of liver disease and massive anasarca.  Can dc Contact precautions as not MRSA in blood.  May discontinue vanco and continue Levaquin in Dr. Sampson Goon agrees. Changed zosyn to levofloxacin for PNA for 7 days total since admit.  2. Hypotension. 2. Hold Lasix and spironolactone.  Given NS bolus. BP is normal with normal saline IV.  * Acute metabolic encephalopathy. Likely due to combination of sepsis, elevated ammonia and dehydration. Improved. Aspiration and fall precaution. Continue lactulose and Continue abx.  3. Abnormal LFT with history of Hepatitis C. Liver function appears to be similar to what it was on last admission. He is off any medications for now.   4. Hypertension. hold Lasix and spiral lactone due to hypotension. Resume lasxi. 5. Anasarca and lower extremity edema. He's been off his Lasix and spironolactone for quite some time. Resume lasix.  6.  Chronic lower extremity lymphedema.  7. History of alcohol abuse. He tells me he has not drank any alcohol in the last 9 months. 8. History of schizophrenia. continue Seroquel.  * hyponatremia. Follow-up BMP while on lasix. * ARF. improved withnormal saline IV. * lactic acidosis. Improved. * anemia of chronic disease.hemoglobin decreased to 8.2, possible due to IV fluid dilution. Repeat Hb is 8.5. * chronic diastolic CHF, normal ejection fraction per previous document. Stable. * morbid obesity.  * Tobacco abuse. Smoking cessation was counseled for 4 minutes.  Psych consult recommend referral to [facility] at discharge. All the records are reviewed and case discussed with Care Management/Social Worker. Management plans discussed with the patient, family and they are in agreement.  CODE STATUS: Full Code  TOTAL TIME TAKING CARE OF THIS PATIENT: 33 minutes.   More than 50% of the time was spent in counseling/coordination of care: YES  POSSIBLE D/C IN 2 DAYS, DEPENDING ON CLINICAL CONDITION.   Shaune Pollack M.D on 08/12/2017 at 1:39 PM  Between 7am to 6pm - Pager - [phone]  After 6pm go to [email or web address] - Scientist, research (life sciences) [facility]  Office  [phone]  CC: Primary care physician; Fawcett-Hardy, Catha Brow, FNP  Note: This dictation was prepared with Dragon  dictation along with smaller phrase technology. Any transcriptional errors that result from this process are unintentional.

## 2017-08-12 NOTE — Progress Notes (Signed)
PT Cancellation Note  Patient Details Name: Lawrence Barajas MRN: [id number] DOB: 1961-06-26   Cancelled Treatment:    Reason Eval/Treat Not Completed: Patient declined, no reason specified.  PT consult received.  Chart reviewed.  Pt sleeping upon therapist arrival but woken easily with vc's.  Pt firmly refusing PT today d/t 10/10 LE pain and wanting to go back to sleep d/t being tired (nursing notified).  Pt requesting to be seen tomorrow in the morning (between 8 and noon).  Will re-attempt PT eval tomorrow.  Hendricks Limes, PT 08/12/17, 2:09 PM [phone]

## 2017-08-12 NOTE — Progress Notes (Signed)
[facility] INFECTIOUS DISEASE PROGRESS NOTE Date of Admission:  08/08/2017     ID: Lawrence Barajas is a 56 y.o. male with cirrhosis, anasarca, sepsis, cns bacteremia Active Problems:   Sepsis (HCC)   Subjective: No fevers, wbc down to 9.  Echo neg, fu bcx pending  ROS  Eleven systems are reviewed and negative except per hpi  Medications:  Antibiotics Given (last 72 hours)    Date/Time Action Medication Dose Rate   08/09/17 1245 New Bag/Given   piperacillin-tazobactam (ZOSYN) IVPB 3.375 g 3.375 g 12.5 mL/hr   08/09/17 1819 New Bag/Given   vancomycin (VANCOCIN) 1,250 mg in sodium chloride 0.9 % 250 mL IVPB 1,250 mg 166.7 mL/hr   08/09/17 2116 New Bag/Given   piperacillin-tazobactam (ZOSYN) IVPB 3.375 g 3.375 g 12.5 mL/hr   08/10/17 0123 New Bag/Given   vancomycin (VANCOCIN) 1,250 mg in sodium chloride 0.9 % 250 mL IVPB 1,250 mg 166.7 mL/hr   08/10/17 0516 New Bag/Given   piperacillin-tazobactam (ZOSYN) IVPB 3.375 g 3.375 g 12.5 mL/hr   08/10/17 0850 New Bag/Given   vancomycin (VANCOCIN) 1,250 mg in sodium chloride 0.9 % 250 mL IVPB 1,250 mg 166.7 mL/hr   08/10/17 1408 New Bag/Given   piperacillin-tazobactam (ZOSYN) IVPB 3.375 g 3.375 g 12.5 mL/hr   08/10/17 2133 New Bag/Given   piperacillin-tazobactam (ZOSYN) IVPB 3.375 g 3.375 g 12.5 mL/hr   08/11/17 0636 New Bag/Given   piperacillin-tazobactam (ZOSYN) IVPB 3.375 g 3.375 g 12.5 mL/hr   08/11/17 0855 New Bag/Given   vancomycin (VANCOCIN) 1,250 mg in sodium chloride 0.9 % 250 mL IVPB 1,250 mg 166.7 mL/hr   08/11/17 1359 New Bag/Given   piperacillin-tazobactam (ZOSYN) IVPB 3.375 g 3.375 g 12.5 mL/hr   08/11/17 1600 New Bag/Given   levofloxacin (LEVAQUIN) IVPB 750 mg 750 mg 100 mL/hr   08/11/17 2004 New Bag/Given   vancomycin (VANCOCIN) 1,250 mg in sodium chloride 0.9 % 250 mL IVPB 1,250 mg 166.7 mL/hr     . citalopram  20 mg Oral Daily  . folic acid  1 mg Oral Daily  . furosemide  40 mg Oral Daily  . heparin  5,000 Units  Subcutaneous Q8H  . multivitamin with minerals  1 tablet Oral Daily  . nicotine  7 mg Transdermal Daily  . potassium chloride  20 mEq Oral Daily  . QUEtiapine  200 mg Oral QHS  . sodium chloride flush  3 mL Intravenous Q12H  . thiamine  100 mg Oral Daily    Objective: Vital signs in last 24 hours: Temp:  [97.8 F (36.6 C)-98.3 F (36.8 C)] 98.2 F (36.8 C) (08/28 0458) Pulse Rate:  [68-72] 72 (08/28 0458) Resp:  [18-20] 18 (08/28 0458) BP: (125-138)/(51-64) 137/54 (08/28 0458) SpO2:  [98 %-99 %] 99 % (08/28 0458) HENT: icteric Mouth/Throat: Oropharynx is clear and dry. No oropharyngeal exudate.  Cardiovascular: Distant, regular Pulmonary/Chest: poor air movement Abdominal: Soft. Bowel sounds are normal. He exhibits no distension. abd wall edema There is no tenderness.  Lymphadenopathy: He has no cervical adenopathy.  Neurological: He is lethargic but arousable Ext - massive bil LE edema/anasarca Scrotal edema Skin:R upper thigh with several abrasions Psychiatric: He has a flat affect.   Lab Results  Recent Labs  08/10/17 0808 08/11/17 0301 08/12/17 0817  WBC 17.1* 11.4* 9.3  HGB 8.1* 8.2* 8.5*  HCT 23.6* 23.4* 23.8*  NA 137  --  132*  K 4.2  --  4.5  CL 115*  --  112*  CO2 20*  --  18*  BUN 30*  --  27*  CREATININE 1.79* 1.55* 1.20   ECHO  Technically difficult study due to chest wall and/or lung   interference. - Left ventricle: The cavity size was normal. Wall thickness was   increased in a pattern of mild LVH. Systolic function was   vigorous. The estimated ejection fraction was in the range of 65%   to 70%. Features are consistent with a pseudonormal left   ventricular filling pattern, with concomitant abnormal relaxation   and increased filling pressure (grade 2 diastolic dysfunction).   Doppler parameters are consistent with high ventricular filling   pressure. - Aortic valve: Trileaflet; mildly thickened, mildly calcified   leaflets. - Left  atrium: The atrium was moderately dilated. - Right ventricle: The cavity size was mildly dilated. Systolic   function was normal. - Right atrium: The atrium was moderately dilated. - No vegetation is identified, though evaluation of the valves is   limited by suboptimal accoustic windows and native valvular   disease.  Microbiology: Results for orders placed or performed during the hospital encounter of 08/08/17  Blood Culture (routine x 2)     Status: Abnormal   Collection Time: 08/08/17  5:22 PM  Result Value Ref Range Status   Specimen Description BLOOD RIGHT ARM  Final   Special Requests   Final    BOTTLES DRAWN AEROBIC AND ANAEROBIC Blood Culture adequate volume   Culture  Setup Time   Final    GRAM POSITIVE COCCI AEROBIC BOTTLE ONLY CRITICAL RESULT CALLED TO, READ BACK BY AND VERIFIED WITH: STEPHANIE SHUDER ON 08/10/17 AT 1111 [facility]    Culture (A)  Final    STAPHYLOCOCCUS SPECIES (COAGULASE NEGATIVE) SUSCEPTIBILITIES PERFORMED ON PREVIOUS CULTURE WITHIN THE LAST 5 DAYS. Performed at [facility], [address], [place], [place] [address]    Report Status 08/12/2017 FINAL  Final  Blood Culture (routine x 2)     Status: Abnormal   Collection Time: 08/08/17  5:56 PM  Result Value Ref Range Status   Specimen Description BLOOD RIGHT HAND  Final   Special Requests   Final    BOTTLES DRAWN AEROBIC AND ANAEROBIC Blood Culture adequate volume   Culture  Setup Time   Final    GRAM POSITIVE COCCI IN BOTH AEROBIC AND ANAEROBIC BOTTLES CRITICAL RESULT CALLED TO, READ BACK BY AND VERIFIED WITH: NATE COOKSON 08/09/17 AT 2034 BY HS GRAM STAIN REVIEWED-AGREE WITH RESULT LAB    Culture STAPHYLOCOCCUS SPECIES (COAGULASE NEGATIVE) (A)  Final   Report Status 08/12/2017 FINAL  Final   Organism ID, Bacteria STAPHYLOCOCCUS SPECIES (COAGULASE NEGATIVE)  Final      Susceptibility   Staphylococcus species (coagulase negative) - MIC*    CIPROFLOXACIN <=0.5 SENSITIVE Sensitive      ERYTHROMYCIN <=0.25 SENSITIVE Sensitive     GENTAMICIN <=0.5 SENSITIVE Sensitive     OXACILLIN >=4 RESISTANT Resistant     TETRACYCLINE >=16 RESISTANT Resistant     VANCOMYCIN 1 SENSITIVE Sensitive     TRIMETH/SULFA <=10 SENSITIVE Sensitive     CLINDAMYCIN <=0.25 SENSITIVE Sensitive     RIFAMPIN <=0.5 SENSITIVE Sensitive     Inducible Clindamycin NEGATIVE Sensitive     * STAPHYLOCOCCUS SPECIES (COAGULASE NEGATIVE)  Urine culture     Status: None   Collection Time: 08/08/17  5:56 PM  Result Value Ref Range Status   Specimen Description URINE, CLEAN CATCH  Final   Special Requests NONE  Final   Culture   Final  NO GROWTH Performed at [facility], [address], [place], [place] [address]    Report Status 08/10/2017 FINAL  Final  Blood Culture ID Panel (Reflexed)     Status: Abnormal   Collection Time: 08/08/17  5:56 PM  Result Value Ref Range Status   Enterococcus species NOT DETECTED NOT DETECTED Final   Listeria monocytogenes NOT DETECTED NOT DETECTED Final   Staphylococcus species DETECTED (A) NOT DETECTED Final    Comment: Methicillin (oxacillin) resistant coagulase negative staphylococcus. Possible blood culture contaminant (unless isolated from more than one blood culture draw or clinical case suggests pathogenicity). No antibiotic treatment is indicated for blood  culture contaminants. CRITICAL RESULT CALLED TO, READ BACK BY AND VERIFIED WITH:  NATE COOKSON 08/09/17 AT 2034 BY HS    Staphylococcus aureus NOT DETECTED NOT DETECTED Final   Methicillin resistance DETECTED (A) NOT DETECTED Final    Comment: CRITICAL RESULT CALLED TO, READ BACK BY AND VERIFIED WITH: NATE COOKSON 08/09/17 AT 2034 BY HS    Streptococcus species NOT DETECTED NOT DETECTED Final   Streptococcus agalactiae NOT DETECTED NOT DETECTED Final   Streptococcus pneumoniae NOT DETECTED NOT DETECTED Final   Streptococcus pyogenes NOT DETECTED NOT DETECTED Final   Acinetobacter baumannii NOT DETECTED  NOT DETECTED Final   Enterobacteriaceae species NOT DETECTED NOT DETECTED Final   Enterobacter cloacae complex NOT DETECTED NOT DETECTED Final   Escherichia coli NOT DETECTED NOT DETECTED Final   Klebsiella oxytoca NOT DETECTED NOT DETECTED Final   Klebsiella pneumoniae NOT DETECTED NOT DETECTED Final   Proteus species NOT DETECTED NOT DETECTED Final   Serratia marcescens NOT DETECTED NOT DETECTED Final   Haemophilus influenzae NOT DETECTED NOT DETECTED Final   Neisseria meningitidis NOT DETECTED NOT DETECTED Final   Pseudomonas aeruginosa NOT DETECTED NOT DETECTED Final   Candida albicans NOT DETECTED NOT DETECTED Final   Candida glabrata NOT DETECTED NOT DETECTED Final   Candida krusei NOT DETECTED NOT DETECTED Final   Candida parapsilosis NOT DETECTED NOT DETECTED Final   Candida tropicalis NOT DETECTED NOT DETECTED Final  MRSA PCR Screening     Status: None   Collection Time: 08/08/17 10:07 PM  Result Value Ref Range Status   MRSA by PCR NEGATIVE NEGATIVE Final    Comment:        The GeneXpert MRSA Assay (FDA approved for NASAL specimens only), is one component of a comprehensive MRSA colonization surveillance program. It is not intended to diagnose MRSA infection nor to guide or monitor treatment for MRSA infections.   Culture, blood (single) w Reflex to ID Panel     Status: None (Preliminary result)   Collection Time: 08/11/17  5:22 PM  Result Value Ref Range Status   Specimen Description BLOOD BLOOD LEFT HAND  Final   Special Requests   Final    BOTTLES DRAWN AEROBIC AND ANAEROBIC Blood Culture results may not be optimal due to an excessive volume of blood received in culture bottles   Culture NO GROWTH < 12 HOURS  Final   Report Status PENDING  Incomplete    Studies/Results: No results found.  Assessment/Plan: Tarrance Jagiello is a 56 y.o. 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  BCX 2/2 with coag neg staph. CXR shows possible R PNA.  Not sure if the CNS bacteremia is real or contaminant. He has no prosthetic material, valves or PPMs.  Echo neg, FU BCX pendig Would  treat for 7 days total for 7 days for PNA.   Recommendations Continue management of liver disease and massive anasarca.  This is his main issue.  Continue  levofloxacin for PNA for 7 days total since admit - stop date 8/30 Thank you very much for the consult. Will follow with you.  Mick Sell   08/12/2017, 12:12 PM

## 2017-08-12 NOTE — Plan of Care (Signed)
Problem: Pain Managment: Goal: General experience of comfort will improve Outcome: Not Progressing Patient states he is not comfortable d/t edema to extremities and scrotum. Despite repositioning and comfort measures. Patient unable to state he is comfortable   Problem: Skin Integrity: Goal: Risk for impaired skin integrity will decrease Outcome: Progressing Foam barriers in place to protect patients skin

## 2017-08-13 LAB — BASIC METABOLIC PANEL
Anion gap: 0 — ABNORMAL LOW (ref 5–15)
BUN: 26 mg/dL — AB (ref 6–20)
CHLORIDE: 113 mmol/L — AB (ref 101–111)
CO2: 21 mmol/L — AB (ref 22–32)
Calcium: 7.7 mg/dL — ABNORMAL LOW (ref 8.9–10.3)
Creatinine, Ser: 1.3 mg/dL — ABNORMAL HIGH (ref 0.61–1.24)
GFR calc Af Amer: >60 mL/min (ref >60)
GFR calc non Af Amer: 60 mL/min — ABNORMAL LOW (ref >60)
GLUCOSE: 91 mg/dL (ref 65–99)
POTASSIUM: 4.5 mmol/L (ref 3.5–5.1)
Sodium: 134 mmol/L — ABNORMAL LOW (ref 135–145)

## 2017-08-13 MED ORDER — FUROSEMIDE 40 MG PO TABS
40.0000 mg | ORAL_TABLET | Freq: Two times a day (BID) | ORAL | Status: DC
  Administered 2017-08-13 (×2): 40 mg via ORAL
  Filled 2017-08-13 (×2): qty 1

## 2017-08-13 MED ORDER — SPIRONOLACTONE 25 MG PO TABS
25.0000 mg | ORAL_TABLET | Freq: Every day | ORAL | Status: DC
  Administered 2017-08-13: 25 mg via ORAL
  Filled 2017-08-13: qty 1

## 2017-08-13 NOTE — Progress Notes (Signed)
[employer] - [place] at [facility]   PATIENT NAME: Lawrence Barajas    MR#:  [id number]  DATE OF BIRTH:  Mar 12, 1961  SUBJECTIVE:  CHIEF COMPLAINT:   Chief Complaint  Patient presents with  . Weakness  . Fever   still leg swelling and scrotal swelling REVIEW OF SYSTEMS:  Review of Systems  Constitutional: Positive for malaise/fatigue. Negative for chills and fever.  HENT: Negative for sore throat.   Eyes: Negative for blurred vision and double vision.  Respiratory: Negative for cough, hemoptysis, shortness of breath, wheezing and stridor.   Cardiovascular: Positive for leg swelling. Negative for chest pain, palpitations and orthopnea.  Gastrointestinal: Negative for abdominal pain, blood in stool, diarrhea, melena, nausea and vomiting.  Genitourinary: Negative for dysuria, flank pain and hematuria.  Musculoskeletal: Negative for back pain and joint pain.  Skin: Negative for rash.  Neurological: Positive for weakness. Negative for dizziness, sensory change, focal weakness, seizures, loss of consciousness and headaches.  Endo/Heme/Allergies: Negative for polydipsia.  Psychiatric/Behavioral: Negative for depression. The patient is not nervous/anxious.     DRUG ALLERGIES:  No Known Allergies VITALS:  Blood pressure (!) 135/52, pulse 71, temperature 98.2 F (36.8 C), temperature source Oral, resp. rate 20, height 6\' 3"  (1.905 m), weight (!) 382 lb 8 oz (173.5 kg), SpO2 98 %. PHYSICAL EXAMINATION:  Physical Exam  Constitutional: He is oriented to person, place, and time and well-developed, well-nourished, and in no distress.  HENT:  Head: Normocephalic.  Mouth/Throat: Oropharynx is clear and moist.  Eyes: Pupils are equal, round, and reactive to light. Conjunctivae and EOM are normal. No scleral icterus.  Neck: Normal range of motion. Neck supple. No JVD present. No tracheal deviation present.  Cardiovascular: Normal rate, regular rhythm and normal heart sounds.   Exam reveals no gallop.   No murmur heard. Pulmonary/Chest: Effort normal and breath sounds normal. No respiratory distress. He has no wheezes. He has no rales.  Abdominal: Soft. Bowel sounds are normal. He exhibits no distension. There is no tenderness. There is no rebound.  Genitourinary: No discharge found.  Genitourinary Comments: scrotal swelling  Musculoskeletal: Normal range of motion. He exhibits edema. He exhibits no tenderness.  Bilateral  chronic lower extremity lymphedema.  Neurological: He is alert and oriented to person, place, and time. No cranial nerve deficit.  Skin: No rash noted. No erythema.  Psychiatric: Affect normal.   LABORATORY PANEL:  Male CBC  Recent Labs Lab 08/12/17 0817  WBC 9.3  HGB 8.5*  HCT 23.8*  PLT 134*   ------------------------------------------------------------------------------------------------------------------ Chemistries   Recent Labs Lab 08/08/17 1722  08/13/17 0426  NA 133*  < > 134*  K 3.8  < > 4.5  CL 109  < > 113*  CO2 20*  < > 21*  GLUCOSE 101*  < > 91  BUN 18  < > 26*  CREATININE 1.07  < > 1.30*  CALCIUM 8.2*  < > 7.7*  AST 119*  --   --   ALT 38  --   --   ALKPHOS 155*  --   --   BILITOT 4.7*  --   --   < > = values in this interval not displayed. RADIOLOGY:  No results found. ASSESSMENT AND PLAN:   1. Sepsis due to Pneumonia (CAP) and MRSA bacteremia.   the patient was treated with Zosyn and vancomycin.  Blood culture showed STAPHYLOCOCCUS SPECIES (COAGULASE NEGATIVE), sensitive to Cipro. Leukocytosis improved. Per Dr. Sampson Goon,  Repeat bcx. So  far negative. Check echo ( LV EF: 65% -   70%. No vegetation is identified) Continue management of liver disease and massive anasarca.  discontinued Contact precautions as not MRSA in blood.  continue levofloxacin for PNA for 7 days total since admit, stop date 8/30 per AnnettaFitzgerald.  2. Hypotension. 2.  Lasix and spironolactone were hold. Given NS bolus. BP is  normal with normal saline IV.  * Acute metabolic encephalopathy. Likely due to combination of sepsis, elevated ammonia and dehydration. Improved. Aspiration and fall precaution.  3. Abnormal LFT with history of Hepatitis C. Liver function appears to be similar to what it was on last admission. He is off any medications for now.   4. Hypertension. hold Lasix and spiral lactone due to hypotension. Resumed lasix. 5. Anasarca and lower extremity edema. He's been off his Lasix and spironolactone for quite some time. Resumed lasix. Resume spironolactone.  6.  Chronic lower extremity lymphedema.  7. History of alcohol abuse. He tells me he has not drank any alcohol in the last 9 months. 8. History of schizophrenia. continue Seroquel.  * hyponatremia. Follow-up BMP while on lasix. * ARF. improved withnormal saline IV. But Cr. Is up to 1.30 on lasix. F/u BMP. * lactic acidosis. Improved. * anemia of chronic disease.hemoglobin decreased to 8.2, possible due to IV fluid dilution. Repeat Hb is 8.5. * chronic diastolic CHF, normal ejection fraction per previous document. Stable. * morbid obesity.  * Tobacco abuse. Smoking cessation was counseled for 4 minutes.  Psych consult recommend referral to [facility] at discharge. PT: SNF placement. All the records are reviewed and case discussed with Care Management/Social Worker. Management plans discussed with the patient, family and they are in agreement.  CODE STATUS: Full Code  TOTAL TIME TAKING CARE OF THIS PATIENT: 33 minutes.   More than 50% of the time was spent in counseling/coordination of care: YES  POSSIBLE D/C IN 2 DAYS, DEPENDING ON CLINICAL CONDITION.   Lawrence Pollackhen, Lawrence Barajas M.D on 08/13/2017 at 12:05 PM  Between 7am to 6pm - Pager - [phone]  After 6pm go to [email or web address] - Scientist, research (life sciences)password [email or web address] [email or web address]  [facility]  Office  [phone]  CC: Primary care physician; Fawcett-Hardy, Catha BrowJune Anne, Barajas  Note: This dictation was  prepared with Dragon dictation along with smaller phrase technology. Any transcriptional errors that result from this process are unintentional.

## 2017-08-13 NOTE — Progress Notes (Signed)
[facility] INFECTIOUS DISEASE PROGRESS NOTE Date of Admission:  08/08/2017     ID: Lawrence Barajas is a 56 y.o. male with cirrhosis, anasarca, sepsis, cns bacteremia Active Problems:   Sepsis (HCC)   Subjective: No fevers, no change in status   ROS  Eleven systems are reviewed and negative except per hpi  Medications:  Antibiotics Given (last 72 hours)    Date/Time Action Medication Dose Rate   08/10/17 2133 New Bag/Given   piperacillin-tazobactam (ZOSYN) IVPB 3.375 g 3.375 g 12.5 mL/hr   08/11/17 0636 New Bag/Given   piperacillin-tazobactam (ZOSYN) IVPB 3.375 g 3.375 g 12.5 mL/hr   08/11/17 0855 New Bag/Given   vancomycin (VANCOCIN) 1,250 mg in sodium chloride 0.9 % 250 mL IVPB 1,250 mg 166.7 mL/hr   08/11/17 1359 New Bag/Given   piperacillin-tazobactam (ZOSYN) IVPB 3.375 g 3.375 g 12.5 mL/hr   08/11/17 1600 New Bag/Given   levofloxacin (LEVAQUIN) IVPB 750 mg 750 mg 100 mL/hr   08/11/17 2004 New Bag/Given   vancomycin (VANCOCIN) 1,250 mg in sodium chloride 0.9 % 250 mL IVPB 1,250 mg 166.7 mL/hr   08/12/17 1822 New Bag/Given   levofloxacin (LEVAQUIN) IVPB 750 mg 750 mg 100 mL/hr   08/13/17 1505 New Bag/Given   levofloxacin (LEVAQUIN) IVPB 750 mg 750 mg 100 mL/hr     . citalopram  20 mg Oral Daily  . folic acid  1 mg Oral Daily  . furosemide  40 mg Oral BID  . heparin  5,000 Units Subcutaneous Q8H  . multivitamin with minerals  1 tablet Oral Daily  . nicotine  7 mg Transdermal Daily  . QUEtiapine  200 mg Oral QHS  . sodium chloride flush  3 mL Intravenous Q12H  . spironolactone  25 mg Oral Daily  . thiamine  100 mg Oral Daily    Objective: Vital signs in last 24 hours: Temp:  [97.8 F (36.6 C)-98.2 F (36.8 C)] 97.8 F (36.6 C) (08/29 1352) Pulse Rate:  [63-74] 63 (08/29 1352) Resp:  [20] 20 (08/29 0445) BP: (118-135)/(49-56) 124/56 (08/29 1352) SpO2:  [98 %-99 %] 98 % (08/29 1352) HENT: icteric Mouth/Throat: Oropharynx is clear and dry. No oropharyngeal  exudate.  Cardiovascular: Distant, regular Pulmonary/Chest: poor air movement Abdominal: Soft. Bowel sounds are normal. He exhibits no distension. abd wall edema There is no tenderness.  Lymphadenopathy: He has no cervical adenopathy.  Neurological: He is lethargic but arousable Ext - massive bil LE edema/anasarca Scrotal edema Skin:R upper thigh with several abrasions Psychiatric: He has a flat affect.   Lab Results  Recent Labs  08/11/17 0301 08/12/17 0817 08/13/17 0426  WBC 11.4* 9.3  --   HGB 8.2* 8.5*  --   HCT 23.4* 23.8*  --   NA  --  132* 134*  K  --  4.5 4.5  CL  --  112* 113*  CO2  --  18* 21*  BUN  --  27* 26*  CREATININE 1.55* 1.20 1.30*   ECHO  Technically difficult study due to chest wall and/or lung   interference. - Left ventricle: The cavity size was normal. Wall thickness was   increased in a pattern of mild LVH. Systolic function was   vigorous. The estimated ejection fraction was in the range of 65%   to 70%. Features are consistent with a pseudonormal left   ventricular filling pattern, with concomitant abnormal relaxation   and increased filling pressure (grade 2 diastolic dysfunction).   Doppler parameters are consistent with high ventricular  filling   pressure. - Aortic valve: Trileaflet; mildly thickened, mildly calcified   leaflets. - Left atrium: The atrium was moderately dilated. - Right ventricle: The cavity size was mildly dilated. Systolic   function was normal. - Right atrium: The atrium was moderately dilated. - No vegetation is identified, though evaluation of the valves is   limited by suboptimal accoustic windows and native valvular   disease.  Microbiology: Results for orders placed or performed during the hospital encounter of 08/08/17  Blood Culture (routine x 2)     Status: Abnormal   Collection Time: 08/08/17  5:22 PM  Result Value Ref Range Status   Specimen Description BLOOD RIGHT ARM  Final   Special Requests   Final     BOTTLES DRAWN AEROBIC AND ANAEROBIC Blood Culture adequate volume   Culture  Setup Time   Final    GRAM POSITIVE COCCI AEROBIC BOTTLE ONLY CRITICAL RESULT CALLED TO, READ BACK BY AND VERIFIED WITH: STEPHANIE SHUDER ON 08/10/17 AT 1111 [facility]    Culture (A)  Final    STAPHYLOCOCCUS SPECIES (COAGULASE NEGATIVE) SUSCEPTIBILITIES PERFORMED ON PREVIOUS CULTURE WITHIN THE LAST 5 DAYS. Performed at [facility], [address] [place], [place] [address]    Report Status 08/12/2017 FINAL  Final  Blood Culture (routine x 2)     Status: Abnormal   Collection Time: 08/08/17  5:56 PM  Result Value Ref Range Status   Specimen Description BLOOD RIGHT HAND  Final   Special Requests   Final    BOTTLES DRAWN AEROBIC AND ANAEROBIC Blood Culture adequate volume   Culture  Setup Time   Final    GRAM POSITIVE COCCI IN BOTH AEROBIC AND ANAEROBIC BOTTLES CRITICAL RESULT CALLED TO, READ BACK BY AND VERIFIED WITH: NATE COOKSON 08/09/17 AT 2034 BY HS GRAM STAIN REVIEWED-AGREE WITH RESULT LAB    Culture STAPHYLOCOCCUS SPECIES (COAGULASE NEGATIVE) (A)  Final   Report Status 08/12/2017 FINAL  Final   Organism ID, Bacteria STAPHYLOCOCCUS SPECIES (COAGULASE NEGATIVE)  Final      Susceptibility   Staphylococcus species (coagulase negative) - MIC*    CIPROFLOXACIN <=0.5 SENSITIVE Sensitive     ERYTHROMYCIN <=0.25 SENSITIVE Sensitive     GENTAMICIN <=0.5 SENSITIVE Sensitive     OXACILLIN >=4 RESISTANT Resistant     TETRACYCLINE >=16 RESISTANT Resistant     VANCOMYCIN 1 SENSITIVE Sensitive     TRIMETH/SULFA <=10 SENSITIVE Sensitive     CLINDAMYCIN <=0.25 SENSITIVE Sensitive     RIFAMPIN <=0.5 SENSITIVE Sensitive     Inducible Clindamycin NEGATIVE Sensitive     * STAPHYLOCOCCUS SPECIES (COAGULASE NEGATIVE)  Urine culture     Status: None   Collection Time: 08/08/17  5:56 PM  Result Value Ref Range Status   Specimen Description URINE, CLEAN CATCH  Final   Special Requests NONE  Final   Culture   Final     NO GROWTH Performed at [facility], [address], [place], [place] [address]    Report Status 08/10/2017 FINAL  Final  Blood Culture ID Panel (Reflexed)     Status: Abnormal   Collection Time: 08/08/17  5:56 PM  Result Value Ref Range Status   Enterococcus species NOT DETECTED NOT DETECTED Final   Listeria monocytogenes NOT DETECTED NOT DETECTED Final   Staphylococcus species DETECTED (A) NOT DETECTED Final    Comment: Methicillin (oxacillin) resistant coagulase negative staphylococcus. Possible blood culture contaminant (unless isolated from more than one blood culture draw or  clinical case suggests pathogenicity). No antibiotic treatment is indicated for blood  culture contaminants. CRITICAL RESULT CALLED TO, READ BACK BY AND VERIFIED WITH:  NATE COOKSON 08/09/17 AT 2034 BY HS    Staphylococcus aureus NOT DETECTED NOT DETECTED Final   Methicillin resistance DETECTED (A) NOT DETECTED Final    Comment: CRITICAL RESULT CALLED TO, READ BACK BY AND VERIFIED WITH: NATE COOKSON 08/09/17 AT 2034 BY HS    Streptococcus species NOT DETECTED NOT DETECTED Final   Streptococcus agalactiae NOT DETECTED NOT DETECTED Final   Streptococcus pneumoniae NOT DETECTED NOT DETECTED Final   Streptococcus pyogenes NOT DETECTED NOT DETECTED Final   Acinetobacter baumannii NOT DETECTED NOT DETECTED Final   Enterobacteriaceae species NOT DETECTED NOT DETECTED Final   Enterobacter cloacae complex NOT DETECTED NOT DETECTED Final   Escherichia coli NOT DETECTED NOT DETECTED Final   Klebsiella oxytoca NOT DETECTED NOT DETECTED Final   Klebsiella pneumoniae NOT DETECTED NOT DETECTED Final   Proteus species NOT DETECTED NOT DETECTED Final   Serratia marcescens NOT DETECTED NOT DETECTED Final   Haemophilus influenzae NOT DETECTED NOT DETECTED Final   Neisseria meningitidis NOT DETECTED NOT DETECTED Final   Pseudomonas aeruginosa NOT DETECTED NOT DETECTED Final   Candida albicans NOT DETECTED NOT  DETECTED Final   Candida glabrata NOT DETECTED NOT DETECTED Final   Candida krusei NOT DETECTED NOT DETECTED Final   Candida parapsilosis NOT DETECTED NOT DETECTED Final   Candida tropicalis NOT DETECTED NOT DETECTED Final  MRSA PCR Screening     Status: None   Collection Time: 08/08/17 10:07 PM  Result Value Ref Range Status   MRSA by PCR NEGATIVE NEGATIVE Final    Comment:        The GeneXpert MRSA Assay (FDA approved for NASAL specimens only), is one component of a comprehensive MRSA colonization surveillance program. It is not intended to diagnose MRSA infection nor to guide or monitor treatment for MRSA infections.   Culture, blood (single) w Reflex to ID Panel     Status: None (Preliminary result)   Collection Time: 08/11/17  5:22 PM  Result Value Ref Range Status   Specimen Description BLOOD BLOOD LEFT HAND  Final   Special Requests   Final    BOTTLES DRAWN AEROBIC AND ANAEROBIC Blood Culture results may not be optimal due to an excessive volume of blood received in culture bottles   Culture NO GROWTH 2 DAYS  Final   Report Status PENDING  Incomplete    Studies/Results: No results found.  Assessment/Plan: Lawrence Barajas is a 56 y.o. 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  BCX 2/2 with coag neg staph. CXR shows possible R PNA.  Not sure if the CNS bacteremia is real or contaminant. He has no prosthetic material, valves or PPMs.  Echo neg, FU BCX 8/27 ngtd Would treat for 7 days total for 7 days for PNA.   Recommendations Continue management of liver disease and massive anasarca.  This is his main issue.  Continue  levofloxacin for PNA for 7 days total since admit - stop date 8/30 Thank you very much for the consult. Will follow with you.  Lawrence Barajas   08/13/2017, 3:13 PM

## 2017-08-13 NOTE — Plan of Care (Signed)
Problem: Health Behavior/Discharge Planning: Goal: Ability to manage health-related needs will improve Outcome: Not Progressing Patient lacks desire to improve health  Problem: Activity: Goal: Ability to tolerate increased activity will improve Outcome: Not Progressing Patient refusing to become physically active

## 2017-08-13 NOTE — NC FL2 (Signed)
[place] MEDICAID FL2 LEVEL OF CARE SCREENING TOOL     IDENTIFICATION  Patient Name: Lawrence Barajas Birthdate: 1961-05-13 Sex: male Admission Date (Current Location): 08/08/2017  [place] and [place] Number:  [address] [id number] [facility] Facility and Address:  [facility], [address], [place], [place] [address]      Provider Number: [id number]  Attending Physician Name and Address:  Shaune Pollack, MD  Relative Name and Phone Number:  Rockwell [place] [phone]     Current Level of Care: Hospital Recommended Level of Care: Skilled Nursing Facility Prior Approval Number:    Date Approved/Denied:   PASRR Number: Pending  Discharge Plan: SNF    Current Diagnoses: Patient Active Problem List   Diagnosis Date Noted  . Sepsis (HCC) 03/03/2017  . Edema of abdominal wall 12/02/2016  . Anasarca 12/02/2016  . Hepatic encephalopathy (HCC) 12/02/2016  . Hepatitis C 12/02/2016  . Schizoaffective disorder (HCC) 12/02/2016  . Alcohol abuse 12/02/2016  . Coagulopathy (HCC) 12/02/2016  . Scrotal edema 12/02/2016    Orientation RESPIRATION BLADDER Height & Weight     Self, Time, Situation, Place    Continent Weight: (!) 382 lb 8 oz (173.5 kg) Height:  6\' 3"  (190.5 cm)  BEHAVIORAL SYMPTOMS/MOOD NEUROLOGICAL BOWEL NUTRITION STATUS      Continent Diet  AMBULATORY STATUS COMMUNICATION OF NEEDS Skin   Limited Assist Verbally Surgical wounds                       Personal Care Assistance Level of Assistance  Bathing, Feeding, Dressing Bathing Assistance: Limited assistance Feeding assistance: Independent Dressing Assistance: Limited assistance     Functional Limitations Info  Sight, Hearing, Speech Sight Info: Adequate Hearing Info: Adequate Speech Info: Adequate    SPECIAL CARE FACTORS FREQUENCY  PT (By licensed PT)     PT Frequency: 5x a week              Contractures Contractures Info: Not present    Additional Factors Info   Code Status, Allergies, Psychotropic Code Status Info: Full Code Allergies Info: NKA Psychotropic Info: citalopram (CELEXA) tablet 20 mg and QUEtiapine (SEROQUEL) tablet 200 mg         Current Medications (08/13/2017):  This is the current hospital active medication list Current Facility-Administered Medications  Medication Dose Route Frequency Provider Last Rate Last Dose  . 0.9 %  sodium chloride infusion  250 mL Intravenous PRN Gracelyn Nurse, MD      . acetaminophen (TYLENOL) tablet 650 mg  650 mg Oral Q6H PRN Gracelyn Nurse, MD   650 mg at 08/13/17 1119   Or  . acetaminophen (TYLENOL) suppository 650 mg  650 mg Rectal Q6H PRN Gracelyn Nurse, MD      . citalopram (CELEXA) tablet 20 mg  20 mg Oral Daily Darliss Ridgel, MD   20 mg at 08/13/17 1119  . folic acid (FOLVITE) tablet 1 mg  1 mg Oral Daily Marcelino Duster D, MD   1 mg at 08/13/17 1119  . furosemide (LASIX) tablet 40 mg  40 mg Oral BID Shaune Pollack, MD   40 mg at 08/13/17 1119  . heparin injection 5,000 Units  5,000 Units Subcutaneous Q8H Gracelyn Nurse, MD   5,000 Units at 08/13/17 1404  . levofloxacin (LEVAQUIN) IVPB 750 mg  750 mg Intravenous Q24H Mick Sell, MD 100 mL/hr at 08/13/17 1505 750 mg at 08/13/17 1505  . multivitamin with minerals tablet 1 tablet  1 tablet Oral Daily Gracelyn NurseJohnston, John D, MD   1 tablet at 08/13/17 1119  . nicotine (NICODERM CQ - dosed in mg/24 hr) patch 7 mg  7 mg Transdermal Daily Oralia ManisWillis, David, MD   7 mg at 08/13/17 1120  . ondansetron (ZOFRAN) tablet 4 mg  4 mg Oral Q6H PRN Gracelyn NurseJohnston, John D, MD       Or  . ondansetron [facility](ZOFRAN) injection 4 mg  4 mg Intravenous Q6H PRN Gracelyn NurseJohnston, John D, MD      . QUEtiapine (SEROQUEL) tablet 200 mg  200 mg Oral QHS Gracelyn NurseJohnston, John D, MD   200 mg at 08/12/17 2115  . sodium chloride 0.9 % bolus 1,000 mL  1,000 mL Intravenous PRN Oralia ManisWillis, David, MD 1,000 mL/hr at 08/10/17 0600 1,000 mL at 08/10/17 0600  . sodium chloride flush (NS) 0.9 % injection 3 mL  3 mL  Intravenous Q12H Gracelyn NurseJohnston, John D, MD   3 mL at 08/13/17 1122  . sodium chloride flush (NS) 0.9 % injection 3 mL  3 mL Intravenous PRN Gracelyn NurseJohnston, John D, MD      . spironolactone (ALDACTONE) tablet 25 mg  25 mg Oral Daily Shaune Pollackhen, Qing, MD   25 mg at 08/13/17 1404  . thiamine (VITAMIN B-1) tablet 100 mg  100 mg Oral Daily Gracelyn NurseJohnston, John D, MD   100 mg at 08/13/17 1119     Discharge Medications: Please see discharge summary for a list of discharge medications.  Relevant Imaging Results:  Relevant Lab Results:   Additional Information SSN [id number]  Darleene Cleavernterhaus, Syvilla Martin R, [place]

## 2017-08-13 NOTE — Clinical Social Work Note (Signed)
Patient was seen by PT and tey are recommending patient go to SNF for short term rehab.  CSW met with patient and he is in agreement to going to SNF, CSW informed him that since he only has Medicaid it will limit his options.  Patient stated he agrees and understands, he gave CSW permission to begin bed search process.  Jones Broom. [place], MSW, [place]  08/13/2017 5:42 PM
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
## 2017-08-14 LAB — MAGNESIUM: Magnesium: 1.9 mg/dL (ref 1.7–2.4)

## 2017-08-14 LAB — BASIC METABOLIC PANEL
ANION GAP: 1 — AB (ref 5–15)
BUN: 33 mg/dL — ABNORMAL HIGH (ref 6–20)
CALCIUM: 7.7 mg/dL — AB (ref 8.9–10.3)
CO2: 21 mmol/L — ABNORMAL LOW (ref 22–32)
CREATININE: 1.55 mg/dL — AB (ref 0.61–1.24)
Chloride: 112 mmol/L — ABNORMAL HIGH (ref 101–111)
GFR, EST AFRICAN AMERICAN: 56 mL/min — AB (ref >60)
GFR, EST NON AFRICAN AMERICAN: 48 mL/min — AB (ref >60)
Glucose, Bld: 85 mg/dL (ref 65–99)
Potassium: 4.4 mmol/L (ref 3.5–5.1)
Sodium: 134 mmol/L — ABNORMAL LOW (ref 135–145)

## 2017-08-14 LAB — PTT FACTOR INHIBITOR (MIXING STUDY)
APTT 1 1 NORMAL PLASMA: 25.8 s (ref 22.9–30.2)
APTT 11 NP MIX, 60 MIN, INCUB.: 27.1 s (ref 22.9–30.2)
aPTT 1:1 NP Incub. Mix Ctl: 27.3 s (ref 22.9–30.2)
aPTT: 46.2 s — ABNORMAL HIGH (ref 22.9–30.2)

## 2017-08-14 MED ORDER — LEVOFLOXACIN 750 MG PO TABS
750.0000 mg | ORAL_TABLET | Freq: Every day | ORAL | Status: AC
  Administered 2017-08-14: 750 mg via ORAL
  Filled 2017-08-14: qty 1

## 2017-08-14 MED ORDER — FUROSEMIDE 40 MG PO TABS
40.0000 mg | ORAL_TABLET | Freq: Every day | ORAL | Status: DC
  Administered 2017-08-14: 09:00:00 40 mg via ORAL
  Filled 2017-08-14: qty 1

## 2017-08-14 MED ORDER — FUROSEMIDE 40 MG PO TABS: 40.0000 mg | ORAL_TABLET | Freq: Every day | ORAL | 1 refills | Status: DC

## 2017-08-14 NOTE — Discharge Instructions (Signed)
Heart healthy diet

## 2017-08-14 NOTE — Discharge Summary (Signed)
[employer] - [place] at [facility]   PATIENT NAME: Lawrence Barajas    MR#:  [id number]  DATE OF BIRTH:  1961/07/04  DATE OF ADMISSION:  08/08/2017   ADMITTING PHYSICIAN: Gracelyn Nurse, MD  DATE OF DISCHARGE: 08/14/2017 PRIMARY CARE PHYSICIAN: Fawcett-Hardy, June Anne, FNP   ADMISSION DIAGNOSIS:  Severe sepsis (HCC) [A41.9, R65.20] Community acquired pneumonia, unspecified laterality [J18.9] DISCHARGE DIAGNOSIS:  Active Problems:   Sepsis (HCC)  SECONDARY DIAGNOSIS:   Past Medical History:  Diagnosis Date  . Bipolar 1 disorder (HCC)   . Bronchitis   . Hepatitis C   . Hypertension   . Leg pain, bilateral   . Pancreatitis, alcoholic, acute   . Schizo affective schizophrenia [facility])    HOSPITAL COURSE:   1. Sepsis due to Pneumonia (CAP)  the patient was treated with Zosyn and vancomycin.  Blood culture showed STAPHYLOCOCCUS SPECIES (COAGULASE NEGATIVE), sensitive to Cipro. Leukocytosis improved. Per Dr. Sampson Goon,  Repeat bcx. So far negative. Check echo ( LV EF: 65% - 70%. No vegetation is identified) Continue management of liver disease and massive anasarca.  discontinued Contact precautions as not MRSA in blood.  completed levofloxacin for PNA for 7 days total since admit, stop date 8/30 per [place].  2. Hypotension. 2.  Lasix and spironolactone were hold. Given NS bolus. BP is normal with normal saline IV.  * Acute metabolic encephalopathy. Likely due to combination of sepsis, elevated ammonia and dehydration. Improved. Aspiration and fall precaution.  3. Abnormal LFT with history of Hepatitis C. Liver function appears to be similar to what it was on last admission. He is off any medications. resume lactulose and rifaximin.  4. Hypertension. Lasix and spiral lactone due to hypotension were hold. Resumed lasix. 5. Anasarca and lower extremity edema. He's been off his Lasix and spironolactone for quite some time. Resumed lasix. The patient  was given Lasix and spironolactone but creatinine is slightly elevated to 1.5. Continue Lasix but hold spironolactone.   6. Chronic lower extremity lymphedema.  7. History of alcohol abuse. He tells me he has not drank any alcohol in the last 9 months. 8. History of schizophrenia. continue Seroquel.  * hyponatremia. Follow-up BMP while on lasix. * ARF. improved withnormal saline IV. But Cr. Is up to 1.5 on lasix and spironolactone. F/u BMP. * lactic acidosis. Improved. * anemia of chronic disease.hemoglobin decreased to 8.2, possible due to IV fluid dilution. Repeat Hb is 8.5. * chronic diastolic CHF, echo LV EF: 65% -   70%. Stable. * morbid obesity.  * Tobacco abuse. Smoking cessation was counseled.Marland Kitchen  Psych consult recommend referral to [facility] discharge. PT: SNF placement. DISCHARGE CONDITIONS:  Stable, discharge to skilled nursing facility today. CONSULTS OBTAINED:  Treatment Team:  Mick Sell, MD Darliss Ridgel, MD DRUG ALLERGIES:  No Known Allergies DISCHARGE MEDICATIONS:   Allergies as of 08/14/2017   No Known Allergies     Medication List    STOP taking these medications   Cetirizine HCl 10 MG Caps Commonly known as:  ZYRTEC ALLERGY   FLUoxetine 10 MG capsule Commonly known as:  PROZAC   fluticasone 50 MCG/ACT nasal spray Commonly known as:  FLONASE   polyethylene glycol packet Commonly known as:  MIRALAX   potassium chloride 20 MEQ packet Commonly known as:  KLOR-CON   spironolactone 100 MG tablet Commonly known as:  ALDACTONE   torsemide 20 MG tablet Commonly known as:  DEMADEX   traMADol 50 MG tablet Commonly known  as:  ULTRAM   traZODone 50 MG tablet Commonly known as:  DESYREL     TAKE these medications   folic acid 1 MG tablet Commonly known as:  FOLVITE Take 1 mg by mouth daily.   furosemide 40 MG tablet Commonly known as:  LASIX Take 1 tablet (40 mg total) by mouth daily.   lactulose 10 GM/15ML solution Commonly known  as:  CHRONULAC Take 45 mLs (30 g total) by mouth 3 (three) times daily.   MULTI-VITAMINS Tabs Take 1 tablet by mouth daily.   QUEtiapine 200 MG tablet Commonly known as:  SEROQUEL Take 200 mg by mouth at bedtime.   rifaximin 550 MG Tabs tablet Commonly known as:  XIFAXAN Take 1 tablet (550 mg total) by mouth 3 (three) times daily.   thiamine 100 MG tablet Take 100 mg by mouth daily.            Discharge Care Instructions        Start     Ordered   08/15/17 0000  furosemide (LASIX) 40 MG tablet  Daily     08/14/17 0938   08/14/17 0000  Increase activity slowly     08/14/17 0955   08/14/17 0000  Diet - low sodium heart healthy     08/14/17 0955       DISCHARGE INSTRUCTIONS:  See AVS.  If you experience worsening of your admission symptoms, develop shortness of breath, life threatening emergency, suicidal or homicidal thoughts you must seek medical attention immediately by calling 911 or calling your MD immediately  if symptoms less severe.  You Must read complete instructions/literature along with all the possible adverse reactions/side effects for all the Medicines you take and that have been prescribed to you. Take any new Medicines after you have completely understood and accpet all the possible adverse reactions/side effects.   Please note  You were cared for by a hospitalist during your hospital stay. If you have any questions about your discharge medications or the care you received while you were in the hospital after you are discharged, you can call the unit and asked to speak with the hospitalist on call if the hospitalist that took care of you is not available. Once you are discharged, your primary care physician will handle any further medical issues. Please note that NO REFILLS for any discharge medications will be authorized once you are discharged, as it is imperative that you return to your primary care physician (or establish a relationship with a primary  care physician if you do not have one) for your aftercare needs so that they can reassess your need for medications and monitor your lab values.    On the day of Discharge:  VITAL SIGNS:  Blood pressure (!) 127/53, pulse 69, temperature 97.9 F (36.6 C), temperature source Oral, resp. rate 16, height 6\' 3"  (1.905 m), weight (!) 382 lb 8 oz (173.5 kg), SpO2 98 %. PHYSICAL EXAMINATION:  GENERAL:  56 y.o.-year-old patient lying in the bed with no acute distress. Morbid obesity. EYES: Pupils equal, round, reactive to light and accommodation. No scleral icterus. Extraocular muscles intact.  HEENT: Head atraumatic, normocephalic. Oropharynx and nasopharynx clear.  NECK:  Supple, no jugular venous distention. No thyroid enlargement, no tenderness.  LUNGS: Normal breath sounds bilaterally, no wheezing, rales,rhonchi or crepitation. No use of accessory muscles of respiration.  CARDIOVASCULAR: S1, S2 normal. No murmurs, rubs, or gallops.  ABDOMEN: Soft, non-tender, non-distended. Bowel sounds present. No organomegaly or mass. Scrotal edema.  EXTREMITIES: No cyanosis, or clubbing. Bilateral leg lymphedema and edema. NEUROLOGIC: Cranial nerves II through XII are intact. Muscle strength 3-4/5 in all extremities. Sensation intact. Gait not checked.  PSYCHIATRIC: The patient is alert and oriented x 3.  SKIN: No obvious rash, lesion, or ulcer.  DATA REVIEW:   CBC  Recent Labs Lab 08/12/17 0817  WBC 9.3  HGB 8.5*  HCT 23.8*  PLT 134*    Chemistries   Recent Labs Lab 08/08/17 1722  08/14/17 0437  NA 133*  < > 134*  K 3.8  < > 4.4  CL 109  < > 112*  CO2 20*  < > 21*  GLUCOSE 101*  < > 85  BUN 18  < > 33*  CREATININE 1.07  < > 1.55*  CALCIUM 8.2*  < > 7.7*  MG  --   --  1.9  AST 119*  --   --   ALT 38  --   --   ALKPHOS 155*  --   --   BILITOT 4.7*  --   --   < > = values in this interval not displayed.   Microbiology Results  Results for orders placed or performed during the  hospital encounter of 08/08/17  Blood Culture (routine x 2)     Status: Abnormal   Collection Time: 08/08/17  5:22 PM  Result Value Ref Range Status   Specimen Description BLOOD RIGHT ARM  Final   Special Requests   Final    BOTTLES DRAWN AEROBIC AND ANAEROBIC Blood Culture adequate volume   Culture  Setup Time   Final    GRAM POSITIVE COCCI AEROBIC BOTTLE ONLY CRITICAL RESULT CALLED TO, READ BACK BY AND VERIFIED WITH: STEPHANIE SHUDER ON 08/10/17 AT 1111 [facility]    Culture (A)  Final    STAPHYLOCOCCUS SPECIES (COAGULASE NEGATIVE) SUSCEPTIBILITIES PERFORMED ON PREVIOUS CULTURE WITHIN THE LAST 5 DAYS. Performed at [facility], [address], [place], [place] [address]    Report Status 08/12/2017 FINAL  Final  Blood Culture (routine x 2)     Status: Abnormal   Collection Time: 08/08/17  5:56 PM  Result Value Ref Range Status   Specimen Description BLOOD RIGHT HAND  Final   Special Requests   Final    BOTTLES DRAWN AEROBIC AND ANAEROBIC Blood Culture adequate volume   Culture  Setup Time   Final    GRAM POSITIVE COCCI IN BOTH AEROBIC AND ANAEROBIC BOTTLES CRITICAL RESULT CALLED TO, READ BACK BY AND VERIFIED WITH: NATE COOKSON 08/09/17 AT 2034 BY HS GRAM STAIN REVIEWED-AGREE WITH RESULT LAB    Culture STAPHYLOCOCCUS SPECIES (COAGULASE NEGATIVE) (A)  Final   Report Status 08/12/2017 FINAL  Final   Organism ID, Bacteria STAPHYLOCOCCUS SPECIES (COAGULASE NEGATIVE)  Final      Susceptibility   Staphylococcus species (coagulase negative) - MIC*    CIPROFLOXACIN <=0.5 SENSITIVE Sensitive     ERYTHROMYCIN <=0.25 SENSITIVE Sensitive     GENTAMICIN <=0.5 SENSITIVE Sensitive     OXACILLIN >=4 RESISTANT Resistant     TETRACYCLINE >=16 RESISTANT Resistant     VANCOMYCIN 1 SENSITIVE Sensitive     TRIMETH/SULFA <=10 SENSITIVE Sensitive     CLINDAMYCIN <=0.25 SENSITIVE Sensitive     RIFAMPIN <=0.5 SENSITIVE Sensitive     Inducible Clindamycin NEGATIVE Sensitive     * STAPHYLOCOCCUS  SPECIES (COAGULASE NEGATIVE)  Urine culture     Status: None   Collection Time: 08/08/17  5:56 PM  Result Value  Ref Range Status   Specimen Description URINE, CLEAN CATCH  Final   Special Requests NONE  Final   Culture   Final    NO GROWTH Performed at [facility], [address], [place], [place] [address]    Report Status 08/10/2017 FINAL  Final  Blood Culture ID Panel (Reflexed)     Status: Abnormal   Collection Time: 08/08/17  5:56 PM  Result Value Ref Range Status   Enterococcus species NOT DETECTED NOT DETECTED Final   Listeria monocytogenes NOT DETECTED NOT DETECTED Final   Staphylococcus species DETECTED (A) NOT DETECTED Final    Comment: Methicillin (oxacillin) resistant coagulase negative staphylococcus. Possible blood culture contaminant (unless isolated from more than one blood culture draw or clinical case suggests pathogenicity). No antibiotic treatment is indicated for blood  culture contaminants. CRITICAL RESULT CALLED TO, READ BACK BY AND VERIFIED WITH:  NATE COOKSON 08/09/17 AT 2034 BY HS    Staphylococcus aureus NOT DETECTED NOT DETECTED Final   Methicillin resistance DETECTED (A) NOT DETECTED Final    Comment: CRITICAL RESULT CALLED TO, READ BACK BY AND VERIFIED WITH: NATE COOKSON 08/09/17 AT 2034 BY HS    Streptococcus species NOT DETECTED NOT DETECTED Final   Streptococcus agalactiae NOT DETECTED NOT DETECTED Final   Streptococcus pneumoniae NOT DETECTED NOT DETECTED Final   Streptococcus pyogenes NOT DETECTED NOT DETECTED Final   Acinetobacter baumannii NOT DETECTED NOT DETECTED Final   Enterobacteriaceae species NOT DETECTED NOT DETECTED Final   Enterobacter cloacae complex NOT DETECTED NOT DETECTED Final   Escherichia coli NOT DETECTED NOT DETECTED Final   Klebsiella oxytoca NOT DETECTED NOT DETECTED Final   Klebsiella pneumoniae NOT DETECTED NOT DETECTED Final   Proteus species NOT DETECTED NOT DETECTED Final   Serratia marcescens NOT DETECTED NOT  DETECTED Final   Haemophilus influenzae NOT DETECTED NOT DETECTED Final   Neisseria meningitidis NOT DETECTED NOT DETECTED Final   Pseudomonas aeruginosa NOT DETECTED NOT DETECTED Final   Candida albicans NOT DETECTED NOT DETECTED Final   Candida glabrata NOT DETECTED NOT DETECTED Final   Candida krusei NOT DETECTED NOT DETECTED Final   Candida parapsilosis NOT DETECTED NOT DETECTED Final   Candida tropicalis NOT DETECTED NOT DETECTED Final  MRSA PCR Screening     Status: None   Collection Time: 08/08/17 10:07 PM  Result Value Ref Range Status   MRSA by PCR NEGATIVE NEGATIVE Final    Comment:        The GeneXpert MRSA Assay (FDA approved for NASAL specimens only), is one component of a comprehensive MRSA colonization surveillance program. It is not intended to diagnose MRSA infection nor to guide or monitor treatment for MRSA infections.   Culture, blood (single) w Reflex to ID Panel     Status: None (Preliminary result)   Collection Time: 08/11/17  5:22 PM  Result Value Ref Range Status   Specimen Description BLOOD BLOOD LEFT HAND  Final   Special Requests   Final    BOTTLES DRAWN AEROBIC AND ANAEROBIC Blood Culture results may not be optimal due to an excessive volume of blood received in culture bottles   Culture NO GROWTH 3 DAYS  Final   Report Status PENDING  Incomplete    RADIOLOGY:  No results found.   Management plans discussed with the patient, family and they are in agreement.  CODE STATUS: Full Code   TOTAL TIME TAKING CARE OF THIS PATIENT: 35 minutes.    Shaune Pollack M.D on 08/14/2017  at 3:28 PM  Between 7am to 6pm - Pager - [phone]  After 6pm go to [email or web address] - Scientist, research (life sciences) [facility]  Office  [phone]  CC: Primary care physician; Fawcett-Hardy, Catha Brow, FNP   Note: This dictation was prepared with Dragon dictation along with smaller phrase technology. Any transcriptional errors that result from this  process are unintentional.

## 2017-08-14 NOTE — Clinical Social Work Note (Addendum)
[facility] number has been received [id number].  Patient has agreed to going to SNF, patient has a bed available at [facility].  Patient to be d/c'ed today to [facility].  Patient is agreeable to plans will transport via ems RN to call report to room 42A, [phone].   Ervin KnackEric R. Lallie Strahm, MSW, Theresia MajorsLCSWA [phone]  08/14/2017 2:57 PM

## 2017-08-14 NOTE — Progress Notes (Signed)
PHARMACIST - PHYSICIAN COMMUNICATION DR:   Chen CONCERNING: Antibiotic IV to Oral Route Change Policy  RECOMMENDATION: This patient is receiving levofloxacin by the intravenous route.  Based on criteria approved by the Pharmacy and Therapeutics Committee, the antibiotic(s) is/are being converted to the equivalent oral dose form(s).   DESCRIPTION: These criteria include:  Patient being treated for a respiratory tract infection, urinary tract infection, cellulitis or clostridium difficile associated diarrhea if on metronidazole  The patient is not neutropenic and does not exhibit a GI malabsorption state  The patient is eating (either orally or via tube) and/or has been taking other orally administered medications for a least 24 hours  The patient is improving clinically and has a Tmax < 100.5  If you have questions about this conversion, please contact the Pharmacy Department  []  ( [phone] )  [place] [x]  ( [phone] )  [facility] []  ( [phone]  [place] []  ( [phone]  [facility] []  ( [phone] )  [facility]   

## 2017-08-14 NOTE — Clinical Social Work Note (Signed)
CSW is awaiting [facility] number and bed offers.  CSW faxed requested clinicals to [facility] awaiting for [facility] number.  CSW to continue to follow patient's progress throughout discharge planning.  Ervin KnackEric R. Kirstan Fentress, MSW, Theresia MajorsLCSWA [phone]  08/14/2017 9:52 AM

## 2017-08-14 NOTE — Progress Notes (Signed)
Pt being discharged to [facility], [facility] to [facility], EMS called for transport, pt with no complaints

## 2017-08-16 LAB — CULTURE, BLOOD (SINGLE): CULTURE: NO GROWTH

## 2017-09-08 ENCOUNTER — Ambulatory Visit (INDEPENDENT_AMBULATORY_CARE_PROVIDER_SITE_OTHER): Payer: Medicaid Other | Admitting: Gastroenterology

## 2017-09-08 ENCOUNTER — Encounter: Payer: Self-pay | Admitting: Gastroenterology

## 2017-09-08 VITALS — BP 151/78 | HR 71 | Temp 98.0°F | Ht 75.0 in

## 2017-09-08 DIAGNOSIS — N179 Acute kidney failure, unspecified: Secondary | ICD-10-CM | POA: Insufficient documentation

## 2017-09-08 DIAGNOSIS — K7031 Alcoholic cirrhosis of liver with ascites: Secondary | ICD-10-CM | POA: Diagnosis not present

## 2017-09-08 DIAGNOSIS — N189 Chronic kidney disease, unspecified: Secondary | ICD-10-CM

## 2017-09-08 DIAGNOSIS — B182 Chronic viral hepatitis C: Secondary | ICD-10-CM | POA: Diagnosis not present

## 2017-09-08 NOTE — Patient Instructions (Addendum)
1. Continue current medications  2. Start torsemide  daily after completing the current dosing regimen 3. Check CBC, CMP and PT/INR 4. Schedule EGD for variceal screening 5. Referral to liver transplant center at [facility] or [facility] for hepatitis C treatment and to establish care given advanced cirrhosis 6. Ultrasound liver and alpha-fetoprotein for [facility] screening  Please call our office to speak with my nurse Iva Lento at [phone] during business hours from 8am to 4pm if you have any questions/concerns. During after hours, you will be redirected to on call GI physician. For any emergency please call 911 or go the nearest emergency room.    Arlyss Repress, MD [address]  [address]  [place], [place] [address]  Main: [phone]  Fax: [phone]
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
## 2017-09-23 ENCOUNTER — Encounter: Payer: Self-pay | Admitting: Medical Oncology

## 2017-09-23 ENCOUNTER — Encounter: Payer: Self-pay | Admitting: Emergency Medicine

## 2017-09-23 ENCOUNTER — Emergency Department
Admission: EM | Admit: 2017-09-23 | Discharge: 2017-09-23 | Disposition: A | Discharge status: Home / Self Care | Payer: Medicaid Other | Attending: Emergency Medicine | Admitting: Emergency Medicine

## 2017-09-23 ENCOUNTER — Emergency Department
Admission: EM | Admit: 2017-09-23 | Discharge: 2017-09-27 | Disposition: A | Discharge status: Home / Self Care | Payer: Medicaid Other | Source: Home / Self Care | Attending: Emergency Medicine | Admitting: Emergency Medicine

## 2017-09-23 ENCOUNTER — Other Ambulatory Visit: Payer: Self-pay

## 2017-09-23 DIAGNOSIS — Z5900 Homelessness unspecified: Secondary | ICD-10-CM

## 2017-09-23 DIAGNOSIS — R600 Localized edema: Secondary | ICD-10-CM | POA: Diagnosis present

## 2017-09-23 DIAGNOSIS — F1410 Cocaine abuse, uncomplicated: Secondary | ICD-10-CM | POA: Insufficient documentation

## 2017-09-23 DIAGNOSIS — I110 Hypertensive heart disease with heart failure: Secondary | ICD-10-CM | POA: Insufficient documentation

## 2017-09-23 DIAGNOSIS — F1020 Alcohol dependence, uncomplicated: Secondary | ICD-10-CM | POA: Insufficient documentation

## 2017-09-23 DIAGNOSIS — I5032 Chronic diastolic (congestive) heart failure: Secondary | ICD-10-CM | POA: Diagnosis not present

## 2017-09-23 DIAGNOSIS — K7030 Alcoholic cirrhosis of liver without ascites: Secondary | ICD-10-CM | POA: Diagnosis not present

## 2017-09-23 DIAGNOSIS — Z79899 Other long term (current) drug therapy: Secondary | ICD-10-CM | POA: Insufficient documentation

## 2017-09-23 DIAGNOSIS — L89152 Pressure ulcer of sacral region, stage 2: Secondary | ICD-10-CM

## 2017-09-23 DIAGNOSIS — R609 Edema, unspecified: Secondary | ICD-10-CM | POA: Diagnosis not present

## 2017-09-23 DIAGNOSIS — B1920 Unspecified viral hepatitis C without hepatic coma: Secondary | ICD-10-CM

## 2017-09-23 DIAGNOSIS — K7460 Unspecified cirrhosis of liver: Secondary | ICD-10-CM

## 2017-09-23 DIAGNOSIS — F17210 Nicotine dependence, cigarettes, uncomplicated: Secondary | ICD-10-CM | POA: Diagnosis not present

## 2017-09-23 LAB — COMPREHENSIVE METABOLIC PANEL
ALT: 61 U/L (ref 17–63)
AST: 170 U/L — AB (ref 15–41)
Albumin: 2.5 g/dL — ABNORMAL LOW (ref 3.5–5.0)
Alkaline Phosphatase: 168 U/L — ABNORMAL HIGH (ref 38–126)
Anion gap: 13 (ref 5–15)
BUN: 44 mg/dL — ABNORMAL HIGH (ref 6–20)
CALCIUM: 8.9 mg/dL (ref 8.9–10.3)
CO2: 20 mmol/L — ABNORMAL LOW (ref 22–32)
CREATININE: 1.75 mg/dL — AB (ref 0.61–1.24)
Chloride: 100 mmol/L — ABNORMAL LOW (ref 101–111)
GFR, EST AFRICAN AMERICAN: 48 mL/min — AB (ref >60)
GFR, EST NON AFRICAN AMERICAN: 42 mL/min — AB (ref >60)
Glucose, Bld: 93 mg/dL (ref 65–99)
Potassium: 4.6 mmol/L (ref 3.5–5.1)
Sodium: 133 mmol/L — ABNORMAL LOW (ref 135–145)
Total Bilirubin: 4.8 mg/dL — ABNORMAL HIGH (ref 0.3–1.2)
Total Protein: 9.6 g/dL — ABNORMAL HIGH (ref 6.5–8.1)

## 2017-09-23 LAB — CBC
HCT: 32.1 % — ABNORMAL LOW (ref 40.0–52.0)
Hemoglobin: 11.4 g/dL — ABNORMAL LOW (ref 13.0–18.0)
MCH: 35.8 pg — ABNORMAL HIGH (ref 26.0–34.0)
MCHC: 35.5 g/dL (ref 32.0–36.0)
MCV: 100.9 fL — AB (ref 80.0–100.0)
PLATELETS: 253 10*3/uL (ref 150–440)
RBC: 3.18 MIL/uL — ABNORMAL LOW (ref 4.40–5.90)
RDW: 19.5 % — AB (ref 11.5–14.5)
WBC: 10 10*3/uL (ref 3.8–10.6)

## 2017-09-23 LAB — AMMONIA: AMMONIA: 67 umol/L — AB (ref 9–35)

## 2017-09-23 LAB — PROTIME-INR
INR: 1.69
PROTHROMBIN TIME: 19.7 s — AB (ref 11.4–15.2)

## 2017-09-23 LAB — ETHANOL: Alcohol, Ethyl (B): 18 mg/dL — ABNORMAL HIGH (ref <10)

## 2017-09-23 NOTE — ED Notes (Signed)
Pt is sleeping soundly. No distress noted. 

## 2017-09-23 NOTE — ED Notes (Signed)
Patient given bath wipes for personal care, change of clothing (blue scrubs) and linen changed.

## 2017-09-23 NOTE — ED Triage Notes (Signed)
Pt to triage via w/c; pt st he was dropped off; st he lives in a nursing home but is unable to state name of nursing home; pt admits to drinking "alot" tonight; c/o swelling & pain to legs; pt with hx cirrhosis
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
## 2017-09-23 NOTE — ED Notes (Signed)
Pt has his pants around his knees and is laying in the bed - he refuses to pull his pants up - he refuses to get completely in the bed - he has thrown his potato chips in the floor and states to leave him alone he is going to sleep
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
## 2017-09-23 NOTE — ED Notes (Addendum)
Pt has card in wallet from [facility]; called & spoke with Crystal who st pt signed himself out of NH on Saturday and has been taken out of the sytem; charge nurse notified

## 2017-09-23 NOTE — ED Notes (Signed)
Spoke with MD concerning plan of care. Pt is altered due to ETOH and possible cocaine. Pt is noncompliant with his current care. Pt is to wait and speak with social work in the morning. No further treatment at this time. Pt given warm blankets and the lights are out for comfort. Will enter a diet order so pt can get a breakfast tray in the morning.

## 2017-09-23 NOTE — ED Notes (Signed)
Lenore Manner from [facility] called.  States that patient signed out AMA on Friday from facility.  Ms. Renette Butters is available to help if any questions. She can be reached at [phone]

## 2017-09-23 NOTE — ED Notes (Signed)
Blood resent to lab 

## 2017-09-23 NOTE — ED Notes (Signed)
Water provided at patient's request. Patient sitting at end of bed, instructed to hit call button if he needs anything further and that breakfast will be brought to him when it arrives. Patient's speech is slurred.

## 2017-09-23 NOTE — Discharge Instructions (Signed)
please follow-up with your doctor or see the [facility] or the [place] [facility] or the [facility] clinic or the [facility] or [facility] care. Return here for any further medical problems. try not to leave the shelter.

## 2017-09-23 NOTE — ED Notes (Signed)
Dr Pershing Proud eval pt and reports that he does not want any more labs drawn and no need for IV - pt is here for social work consult - pt given water and sandwich tray at this time
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
## 2017-09-23 NOTE — ED Notes (Signed)
AAOx3.  Skin warm and dry.  nad 

## 2017-09-24 NOTE — NC FL2 (Signed)
[place] MEDICAID FL2 LEVEL OF CARE SCREENING TOOL     IDENTIFICATION  Patient Name: Lawrence Barajas Birthdate: 1961-05-12 Sex: male Admission Date (Current Location): 09/23/2017  [place] and [place] Number:  [address] [id number] [facility] Facility and Address:  [facility], [address] [place], [place] [address]      Provider Number: [id number]  Attending Physician Name and Address:  No att. providers found  Relative Name and Phone Number:       Current Level of Care: Hospital Recommended Level of Care: Skilled Nursing Facility Prior Approval Number:    Date Approved/Denied:   PASRR Number: [id number]  Discharge Plan: SNF    Current Diagnoses: Patient Active Problem List   Diagnosis Date Noted  . Acute renal failure superimposed on chronic kidney disease (HCC) 09/08/2017  . Sepsis (HCC) 03/03/2017  . Diastolic CHF, chronic (HCC) 02/14/2017  . Anemia 02/13/2017  . C. difficile colitis 02/13/2017  . Colitis, acute 02/13/2017  . Edema of abdominal wall 12/02/2016  . Anasarca 12/02/2016  . Encephalopathy, portal systemic (HCC) 12/02/2016  . Hepatitis C 12/02/2016  . Schizoaffective disorder (HCC) 12/02/2016  . Alcohol abuse 12/02/2016  . Coagulopathy (HCC) 12/02/2016  . Scrotal edema 12/02/2016  . Abnormal LFTs 11/15/2016  . Anxiety and depression 11/15/2016  . Colitis 11/15/2016  . Elevated troponin 11/15/2016  . Essential hypertension 11/15/2016    Orientation RESPIRATION BLADDER Height & Weight     Self, Time, Situation, Place  Normal Continent Weight: 300 lb (136.1 kg) Height:   (190.5 cm)  BEHAVIORAL SYMPTOMS/MOOD NEUROLOGICAL BOWEL NUTRITION STATUS      Continent Diet  AMBULATORY STATUS COMMUNICATION OF NEEDS Skin   Limited Assist Verbally Normal                       Personal Care Assistance Level of Assistance  Bathing, Feeding, Dressing Bathing Assistance: Limited assistance Feeding assistance: Independent Dressing  Assistance: Limited assistance     Functional Limitations Info  Sight, Hearing, Speech Sight Info: Adequate Hearing Info: Adequate Speech Info: Adequate (Sometimes slurred)    SPECIAL CARE FACTORS FREQUENCY                       Contractures Contractures Info: Not present    Additional Factors Info  Code Status, Allergies, Psychotropic Code Status Info: Full Allergies Info: No Known Allergies Psychotropic Info: See MAR         Current Medications (09/24/2017):  This is the current hospital active medication list No current facility-administered medications for this encounter.    Current Outpatient Prescriptions  Medication Sig Dispense Refill  . acetaminophen (TYLENOL) 650 MG CR tablet Take 650 mg by mouth every 8 (eight) hours as needed for pain.    Marland Kitchen FLUoxetine (PROZAC) 10 MG capsule Take 10 mg by mouth.    . folic acid (FOLVITE) 1 MG tablet Take 1 mg by mouth daily.    Marland Kitchen lactulose (CHRONULAC) 10 GM/15ML solution Take 45 mLs (30 g total) by mouth 3 (three) times daily. 240 mL 0  . Multiple Vitamin (MULTI-VITAMINS) TABS Take 1 tablet by mouth daily.    . potassium chloride (KLOR-CON) 20 MEQ packet Take by mouth 2 (two) times daily.    . QUEtiapine (SEROQUEL) 200 MG tablet Take 200 mg by mouth at bedtime.    Marland Kitchen spironolactone (ALDACTONE) 25 MG tablet Take 25 mg by mouth daily.    Marland Kitchen thiamine 100 MG tablet Take 100 mg by  mouth daily.    Marland Kitchen torsemide (DEMADEX) 20 MG tablet Take 20 mg by mouth daily.        Discharge Medications: Please see discharge summary for a list of discharge medications.  Relevant Imaging Results:  Relevant Lab Results:   Additional Information SSN: [id number]  Dominic Pea, LCSW

## 2017-09-24 NOTE — ED Notes (Signed)
In to check on pt. Pt has stripped down and is laying on his stomach. Pt has also removed the sheet that this RN covered him up with.

## 2017-09-24 NOTE — ED Notes (Signed)
Patient assisted with putting new socks on, gown, warm blankets. Given peanut butter and crackers, apple sauce, water and remote for TV. Given call bell to call out if needs anything. Bed alarm also placed on patient
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
## 2017-09-24 NOTE — ED Notes (Signed)
Patient given dinner tray and water re-fill

## 2017-09-24 NOTE — ED Notes (Signed)
Pt asks for food. Told him breakfast will be here later.

## 2017-09-24 NOTE — ED Provider Notes (Signed)
patient is refusing all attempts at placement. Patient just wants to go and drink and therefore he will remain homeless. He does not want to go to the shelter or back to his nursing home. He does have Medicaid and could go back should he desire to.   Emily Filbert, MD 09/24/17 1030
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
## 2017-09-24 NOTE — ED Notes (Signed)
Patient assisted back in bed after eating. Food noted to be all over floor after patient was done eating. Patient given warm blankets and water cup

## 2017-09-24 NOTE — Discharge Instructions (Addendum)
You are being discharged to care home, starting back on previous medications that you on at the end of your hospital stay in August.  Please return to the emergency department immediately for any confusion altered mental status, fever, aggression, depression, hearing voices, or any other symptoms concerning to you.

## 2017-09-24 NOTE — Care Management Note (Signed)
Case Management Note  Patient Details  Name: Lawrence Barajas MRN: [id number] Date of Birth: 03/27/1961  Subjective/Objective:    Went to bedside with CSW [place] in attendance. Talked to patient and verified that he does not have his home medications. Patient also states he has no family or friends to contact. Although he states he wants to go to [facility], he does not have a way to get there. Patient does not want to go to detox, or the rescue shelter. States he has no answers for [place]. Just wants to eat something. Spoke to MD and he will be planning for discharge of the pt.                Action/Plan:   Expected Discharge Date:                  Expected Discharge Plan:     In-House Referral:     Discharge planning Services     Post Acute Care Choice:    Choice offered to:     DME Arranged:    DME Agency:     HH Arranged:    HH Agency:     Status of Service:     If discussed at [employer] of Stay Meetings, dates discussed:    Additional Comments:  Berna Bue, RN 09/24/2017, 10:29 AM

## 2017-09-24 NOTE — ED Notes (Signed)
Patient reported to MD Mayford Knife he will go back to [facility]. This RN called social worker to make her aware to set up arrangements. Left voicemail message and waiting to hear back from social worker.

## 2017-09-24 NOTE — ED Notes (Signed)
Pt is laying off the end of the bed with the stretcher half off. Pt is refusing to stand up and cooperate for safety. [employer] officer had to be called to the bedside. Pt is able body but refusing to stand until the office reached the room. He then began to sling himself as if he was going to fall. This RN had to tell him to stop as we would be unable to get him off the floor. Pt told that id he did not cooperate and behave he would be sent out.

## 2017-09-24 NOTE — ED Notes (Signed)
Patient given water re-fill and sandwich tray

## 2017-09-24 NOTE — ED Notes (Signed)
Social work at bedside.  

## 2017-09-24 NOTE — ED Notes (Signed)
Patient assisted to toilet. Not very steady on feet. Reported to this RN he was given a walker yesterday by [facility] and left it at homeless shelter. Patient now sitting on side of bed with food tray eating.

## 2017-09-25 NOTE — ED Notes (Signed)
Patient given meal tray at this time with water.  Patient eating well and is in no obvious distress at this time.

## 2017-09-25 NOTE — ED Notes (Signed)
Pt given meal tray.

## 2017-09-25 NOTE — ED Notes (Signed)
[facility] DSS in room to interview patient privately at this time.  Will continue to monitor.

## 2017-09-25 NOTE — ED Notes (Signed)
Pt had gotten out of the bed, urinated on the floor, on the bed, and on his person; pt was assisted to clean up the bed, floor, and himself; provided with new gown, bed linens changed.

## 2017-09-25 NOTE — ED Notes (Signed)
Pt was provided urinal. Voided 200 ml ini urinal rest was in bed. Pt was agitated and was using foul language. RN cleaned room and urine up. RN will continue to monitor.

## 2017-09-25 NOTE — ED Provider Notes (Signed)
Received patient and ongoing care. Clinical social work still coordinating between DSS and attempting placement. Patient hemodynamic stable, in no distress. Eating meals. Rest and compliant with care.   Sharyn Creamer, MD 09/25/17 [phone]

## 2017-09-25 NOTE — ED Notes (Signed)
[phone] Luellen Pucker from DSS

## 2017-09-25 NOTE — ED Notes (Signed)
Patient given meal tray at this time.

## 2017-09-25 NOTE — ED Notes (Signed)
Patient is resting comfortably. 

## 2017-09-25 NOTE — ED Notes (Signed)
Pt transferred from an ED stretcher to an hospital bed.

## 2017-09-25 NOTE — Clinical Social Work Note (Addendum)
CSW left voicemail for Dario Guardian at [facility]) [phone]. CSW spoke with Lenore Manner at [facility] [phone]) who stated Administrator is waiting for patient monthly liability amount from [facility] worker and will get back with CSW.   Pt has been declined by the following SNFs: [facility], [facility], [facility], [facility], and [facility]. CSW spoke with April at [facility] who stated a team member may have to come out and assess pt before making a decision, but will call CSW back later with an update. Pt was seen today by [facility] Adult Pensions consultant [employer]. CSW left voicemail for Ms. Black at [phone]. CSW staffed case with Social Work AD Zack and discussed barriers to discharge. CSW continuing to follow and pursue placement.  Corlis Hove, Theresia Majors, [facility] Clinical Social Worker-ED [phone]
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
## 2017-09-26 LAB — COMPREHENSIVE METABOLIC PANEL
ALBUMIN: 2 g/dL — AB (ref 3.5–5.0)
ALT: 59 U/L (ref 17–63)
ANION GAP: 6 (ref 5–15)
AST: 175 U/L — AB (ref 15–41)
Alkaline Phosphatase: 192 U/L — ABNORMAL HIGH (ref 38–126)
BUN: 27 mg/dL — ABNORMAL HIGH (ref 6–20)
CHLORIDE: 108 mmol/L (ref 101–111)
CO2: 23 mmol/L (ref 22–32)
Calcium: 8.6 mg/dL — ABNORMAL LOW (ref 8.9–10.3)
Creatinine, Ser: 1.24 mg/dL (ref 0.61–1.24)
GFR calc Af Amer: >60 mL/min (ref >60)
GLUCOSE: 106 mg/dL — AB (ref 65–99)
POTASSIUM: 4.2 mmol/L (ref 3.5–5.1)
Sodium: 137 mmol/L (ref 135–145)
Total Bilirubin: 3.6 mg/dL — ABNORMAL HIGH (ref 0.3–1.2)
Total Protein: 7.9 g/dL (ref 6.5–8.1)

## 2017-09-26 NOTE — ED Notes (Signed)
Report given to Noel RN 

## 2017-09-26 NOTE — ED Provider Notes (Signed)
Discussed with hospitalist service they do not want to put him upstairs they don't have many rooms up there either. He wishes to keep him down here case manager will continue to work on this patient   Arnaldo Natal, MD 09/26/17 1606

## 2017-09-26 NOTE — ED Notes (Signed)
Lawrence Barajas from social work was contacted by Conservation officer, nature for update on placement.

## 2017-09-26 NOTE — Progress Notes (Signed)
Clinical Education officer, museum (CSW) contacted the following facilities regarding placement for patient:  [facility]: Per admissions coordinator Chantel they cannot offer patient a bed.  [facility]: Per admissions coordinator Gerald Stabs they do not have any medicaid beds.   [facility]: Per admissions coordinator Chrys Racer she will review referral.   CSW also attempted to contact [facility] homeless shelter and they do not have any beds. CSW contacted [facility] homeless shelter in [place] and they do not have any beds. CSW attempted to contact [facility] homeless shelter in [place] with no answer and a voicemail was left.   CSW met with patient to offer him a taxi voucher for any where in [place] approved by Pensions consultant. Patient reported that he is sick and not feeling well today and has no place to go. CSW made CSW Production assistant, radio aware of above.   [employer], LCSW [phone]

## 2017-09-26 NOTE — ED Provider Notes (Signed)
Patient's vital signs remained stable DSS is still working with the patient   Arnaldo Natal, MD 09/26/17 1201

## 2017-09-26 NOTE — ED Notes (Signed)
Report given to York Ram, Child psychotherapist.

## 2017-09-26 NOTE — ED Provider Notes (Signed)
More recent update patient is now walking by himself to go to the bathroom. Patient has been turned down by by 12 skilled nursing facilities as I understand. Social worker is trying to get him to a shelter in [place] at this time.Arnaldo Natal, MD 09/26/17 1359

## 2017-09-26 NOTE — ED Notes (Signed)
Patient given crackers, peanut butter, vanilla icecream, and water

## 2017-09-26 NOTE — ED Notes (Addendum)
Fredric Mare from social work called and stated that [facility] and [place] shelters were full and they wouldn't do an intake over the weekend. Dr. Darnelle Catalan is aware.

## 2017-09-26 NOTE — ED Notes (Signed)
Patients room noted to have food and trash all over floor and pee around toilet. Environmental services called and changed trash, swept, and mopped floor. Patients gown and linen changed. Patient also cleaned self with wipes.

## 2017-09-27 MED ORDER — LACTULOSE 10 GM/15ML PO SOLN: 30.0000 g | Freq: Three times a day (TID) | ORAL | 0 refills | Status: AC

## 2017-09-27 MED ORDER — FOLIC ACID 1 MG PO TABS: 1.0000 mg | ORAL_TABLET | Freq: Every day | ORAL | 0 refills | Status: AC

## 2017-09-27 MED ORDER — MULTI-VITAMINS PO TABS: 1.0000 | ORAL_TABLET | Freq: Every day | ORAL | 0 refills | Status: AC

## 2017-09-27 MED ORDER — QUETIAPINE FUMARATE 200 MG PO TABS: 200.0000 mg | ORAL_TABLET | Freq: Every day | ORAL | 0 refills | Status: AC

## 2017-09-27 MED ORDER — THIAMINE HCL 100 MG PO TABS: 100.0000 mg | ORAL_TABLET | Freq: Every day | ORAL | 0 refills | Status: AC

## 2017-09-27 NOTE — ED Provider Notes (Addendum)
social worker made and found a transitional care home in [facility] which except the patient.  I was able to go back through the patient's emergency department visits and most recent hospitalization which was 08/14/2017 in order to try to determine if the patient was on home medications, and what the most recent list might be.  The patient tells me that he has not been on medications for "months" and thinks the only medicine he might be on is Seroquel, but he is off that of course for "months. "  Patient tells me has this history of schizophrenia and states that things have been basically under control, and he does not seem to be exhibiting any sign of psychosis or interaction with internal stimuli.  The most recent medication list that I can find would be from the last hospital stay which she was treated for sepsis and pneumonia at the end of August.  The medications listed after that visit were full at acid 1 mg daily Furosemide 40 mg tablet once daily Lactulose 30 g by mouth 3 times daily Multivitamin 1 tablet once daily Seroquel 200 mg tablet at that time  rifaxamin 550 mg 3 times a day Thiamine 100 mg daily  In trying to assess if patient should be placed back on these medications, I'm concerned that he has not obtained then nor been on them for multiple months. I'm concerned with an underlying chronic renal dysfunction about putting him on furosemide. He does not appear to be having any acute exacerbation of shortness of breath. He does have some lower extremity edema. I feel like it's more risky than beneficial to start him right now until he is hooked back up with primary care for follow-up. Social worker is going to make referral to case management which hopefully then can establish him with primary care and perhaps then he could be related evaluated for that.  We did not check an ammonia level at this hospital stay, but he is not really confused or agitated or somnolent, so I don't  feel like we need to check back, however I will go ahead and restart him on his lactulose, and hold/not restart rifaximin because I dont think there is acute exacerbation of hepatic encephalopathy, but feel the benefit is worth the risk for this getting restarted with lactulose given liver disease.  Although the patient does not appear to have active psychiatric symptoms, I am concerned that not starting him or continuing him back on what he was previously on Clomid leave him at risk for psychiatric exacerbation, and so I am going to fill a one-month prescription for the Seroquel to help him get restarted on that.  the plan per social work would be to discharge him with a cab voucher in order for him to go to a pharmacy and get these medications filled and then go on to his new transitional care home in [facility].   Governor Rooks, MD 09/27/17 1114    Governor Rooks, MD 09/27/17 [phone]

## 2017-09-27 NOTE — Progress Notes (Addendum)
  Pranish Akhavan is a 56 y.o. male referred for: pychocosocial stressors related to: homelessness.  LCSW spoke to [place] who has arranged for patient to go to [facility] in [place].  LCSW met with patient to discuss this as an option.  Patient is pleased with this option and thankful for the assistance.  Patient understands the transitional housing is in [place] and that he will have to pay for this housing with his monthly check.  LCSW spoke with Kendra Opitz ( owner of the Transitional home).  She agrees to take patient and would like a call when patient is on the way.    Taxi voucher provided.  Patient is not taking medication at this time. Reports he has been off of them for months.  LCSW spoke with ED MD who states patient is medically stable to discharge and provided Rx's for patient to take with him.    INTERVENTION:  Solution-Focused Strategies and Supportive Counseling, Reflective listening.  ISSUES DISCUSSED: support system, community resources,  [facility] care management services. ASSESSMENT:. Patient is experiencing homeless with chronic medical conditions.  He has no phone, no family and no support system. He may benefit from, and is in agreement to receive assist from [facility] to assist with managing his chronic medical conditions. Patient provided verbal consent for referral to assist with coordination of care.   PLAN: Patient will discharge via taxi to the [facility] and .LCSW will make referral to [facility] for case management services to assist with getting patient connect to a PCP.    Casimer Lanius, LCSW Licensed Clinical Social Worker 12:28 PM

## 2017-09-27 NOTE — ED Notes (Signed)
Patient observed lying in bed with eyes closed  Even, unlabored respirations observed   NAD pt appears to be sleeping  I will continue to monitor along with every 15 minute visual observations and ongoing security monitoring    

## 2017-09-27 NOTE — ED Notes (Signed)
ED Is the patient under IVC or is there intent for IVC:  no Is the patient medically cleared: Yes.   Is there vacancy in the ED BHU: Yes.   Is the population mix appropriate for patient:  No  Is the patient awaiting placement in inpatient or outpatient setting:   Has the patient had a psychiatric consult: Yes.   Survey of unit performed for contraband, proper placement and condition of furniture, tampering with fixtures in bathroom, shower, and each patient room: Yes.  ; Findings:  APPEARANCE/BEHAVIOR Calm and cooperative NEURO ASSESSMENT Orientation: oriented x4  Denies pain Hallucinations: No.None noted (Hallucinations) Speech: Normal Gait: normal RESPIRATORY ASSESSMENT Even  Unlabored respirations  CARDIOVASCULAR ASSESSMENT Pulses equal   regular rate  Skin warm and dry   GASTROINTESTINAL ASSESSMENT no GI complaint EXTREMITIES Full ROM  PLAN OF CARE Provide calm/safe environment. Vital signs assessed twice daily. ED BHU Assessment once each 12-hour shift. Collaborate with TTS daily or as condition indicates. Assure the ED provider has rounded once each shift. Provide and encourage hygiene. Provide redirection as needed. Assess for escalating behavior; address immediately and inform ED provider.  Assess family dynamic and appropriateness for visitation as needed: Yes.  ; If necessary, describe findings:  Educate the patient/family about BHU procedures/visitation: Yes.  ; If necessary, describe findings:

## 2017-09-27 NOTE — ED Provider Notes (Signed)
-----------------------------------------   6:30 AM on 09/27/2017 -----------------------------------------   Blood pressure 130/86, pulse 75, temperature 98.1 F (36.7 C), temperature source Oral, resp. rate 18, height  (1.905 m), weight 136.1 kg (300 lb), SpO2 100 %.  The patient had no acute events since last update.  Sleeping at this time.  Disposition is pending CSW team recommendations.     Irean Hong, MD 09/27/17 0630

## 2017-09-27 NOTE — ED Provider Notes (Signed)
apparently there was no way to have the patient discharged to a pharmacy and then to the transitional care home.  Patient will take the prescriptions with them and that social worker is going to flag the chart and follow-up on Tuesday to make sure that the patient is getting access for primary care and medication management help so that he can obtain his prescriptions.  The patient does not seem to have an acute hepatic encephalopathy or acute psychiatric condition, and he has been off of his medications now for approximately 2 months, and has been cooperative here in the emergency department not on any medications, although on the one hand I would prefer him to obtain his medications and get started back on them, I don't think this is an acute reason to hold him in the emergency department. We have made as good of a plan as we can in terms of making sure that he's followed back up with to get his primary evaluation, medications, and psychiatric follow-up through case management, and the social worker will check in on Tuesday with his case.   Governor Rooks, MD 09/27/17 [phone]

## 2017-09-27 NOTE — ED Notes (Signed)
Assessment completed  - he is lying in bed  NAD observed  Continue to monitor

## 2017-09-27 NOTE — ED Notes (Addendum)
Pt dressed into blue paper scrubs and undies, continue HIGH FALL RISK, reports pain in left leg chronic, pronounced swelling in the same, pt reports from [facility] unable to report where specifically or living situation, pt denies health or medical concerns - actually deferred to this RN "you the doc!" - pt is poor historian and difficult to understand, pink pressure bandages on bilateral breaks in skin on lower gluteal, pt ambulatory to toilet, given meal tray, bedding changed and pt cleaned with wipes, pt moved to ED 22 at this time in hospital bed

## 2017-09-27 NOTE — ED Notes (Signed)
BEHAVIORAL HEALTH ROUNDING Patient sleeping: No. Patient alert and oriented: yes Behavior appropriate: Yes.  ; If no, describe:  Nutrition and fluids offered: yes Toileting and hygiene offered: Yes  Sitter present: q15 minute observations and security  monitoring Law enforcement present: Yes  ODS  

## 2017-09-27 NOTE — ED Notes (Signed)

## 2017-09-27 NOTE — Progress Notes (Signed)
CSW Chiropodist spoke to Nursing on late yesterday afternoon to inquire about the patient's ability to ambulate. Per nursing, patient was able to ambulate to and from the bathroom independently. A subsequent call was made to covering CSW who stated EDP had confirmed the patient's change in ambulatory status. The patient was approved and accepted to go to a transitional housing program in [place]. The program is awaiting his arrival this morning. ED CSW will make transportation arrangements.  Gretta Cool, LCSW Chiropodist Clinical Social Work Department [employer] [phone]

## 2017-09-27 NOTE — ED Notes (Signed)
Social worker is currently in his room

## 2017-09-30 ENCOUNTER — Telehealth: Payer: Self-pay

## 2017-09-30 ENCOUNTER — Encounter: Payer: Self-pay | Admitting: Licensed Clinical Social Worker

## 2017-09-30 NOTE — Telephone Encounter (Signed)
Contacted [facility] To inform them patient has not had Hep C Genotype or HCV RNA Quant.  Thanks [employer]

## 2017-09-30 NOTE — Progress Notes (Signed)
Hospital F/U call to patient's transitional home to ask for phone number to make [facility] referral.  Spoke with owner Jinny Sanders [phone].  States she is connecting patient to [facility] services.  She has gotten him clothes, he has all of his medications, she will take him to apply for foodstamps and to get a phone, she has a med. Tech on staff to ensure patient takes his medication and will provide transportation to his appointments.  LCSW will not make referral all of patient's needs are being.   No further LCSW services are needs at this time.  Sammuel Hines, LCSW Licensed Clinical Social Worker [facility]   [phone] 10:51 AM

## 2017-12-08 IMAGING — US US ART/VEN ABD/PELV/SCROTUM DOPPLER LTD
1 series · 14 of 25 positions shown · non-contrast
Comparison: None.

CLINICAL DATA: Bilateral scrotal pain and swelling for several days

EXAM:
SCROTAL ULTRASOUND
DOPPLER ULTRASOUND OF THE TESTICLES
TECHNIQUE: Complete ultrasound examination of the testicles, epididymis, and
other scrotal structures was performed. Color and spectral Doppler
ultrasound were also utilized to evaluate blood flow to the
testicles.

[Series 1: us art/ven abd/pelv/scrotum doppler ltd · 0.22mm/px · 14 of 38 slices shown]
[im 1/38]
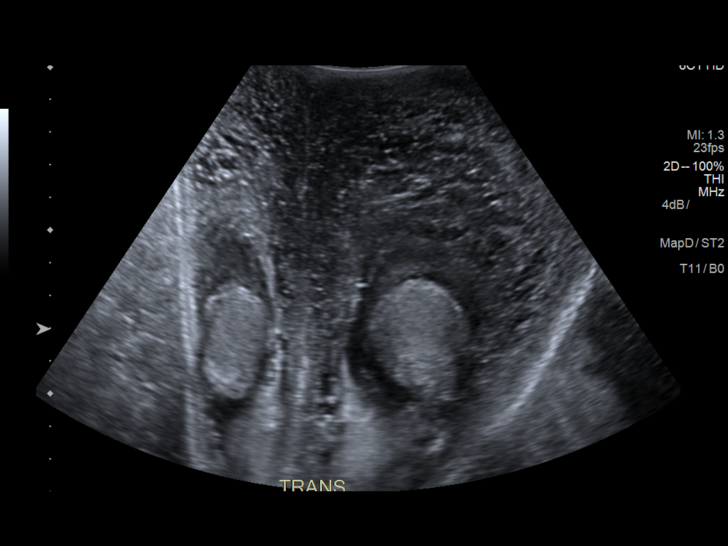
[im 4/38]
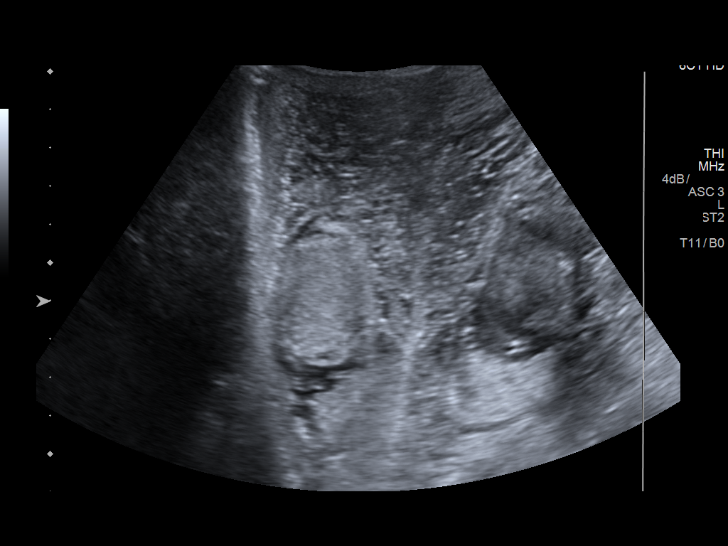
[im 7/38]
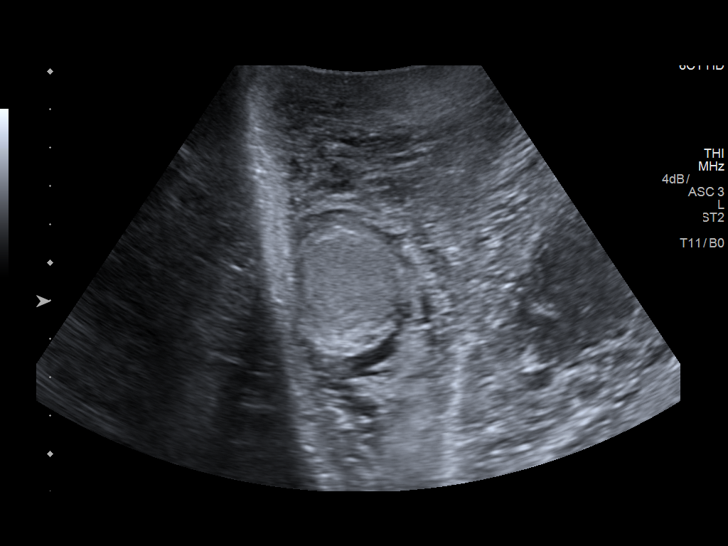
[im 10/38]
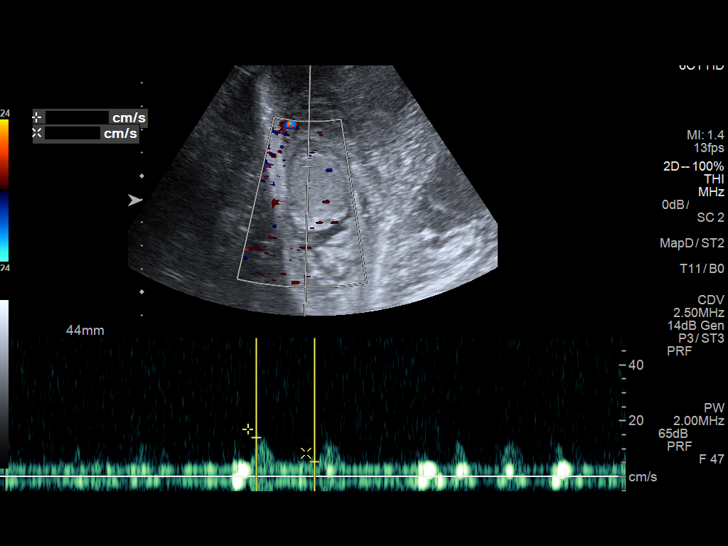
[im 13/38]
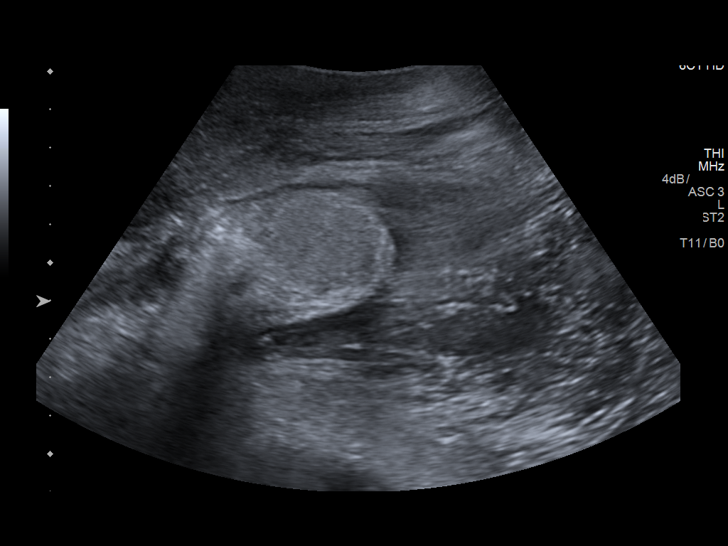
[im 14/38]
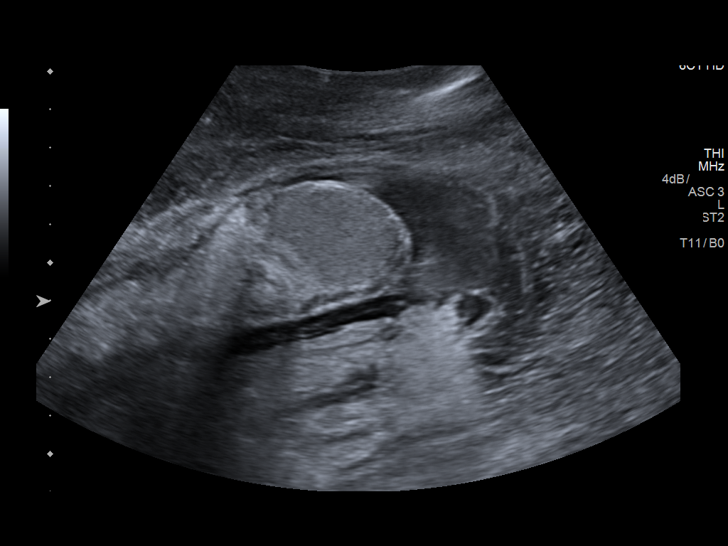
[im 17/38]
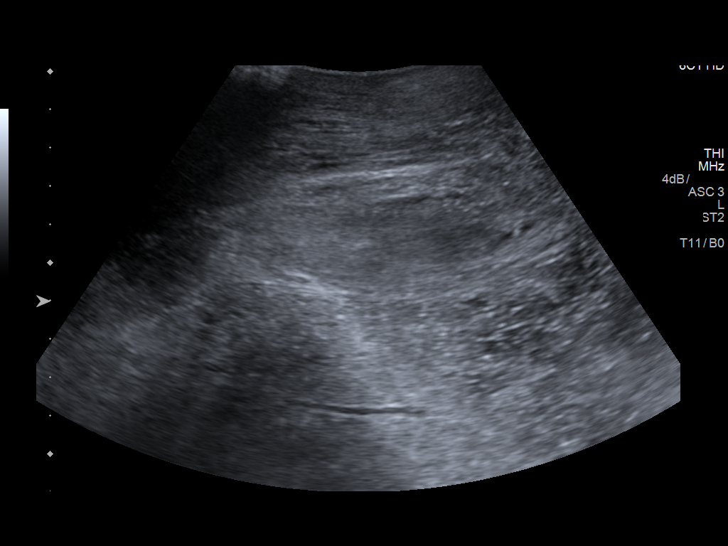
[im 21/38]
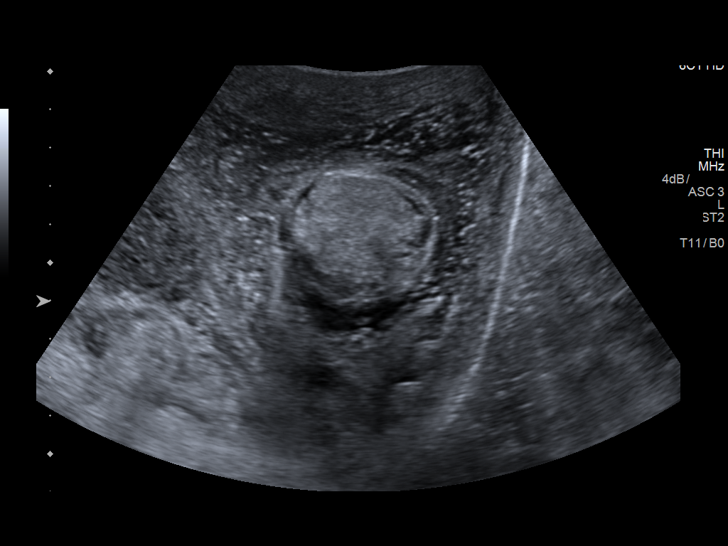
[im 24/38]
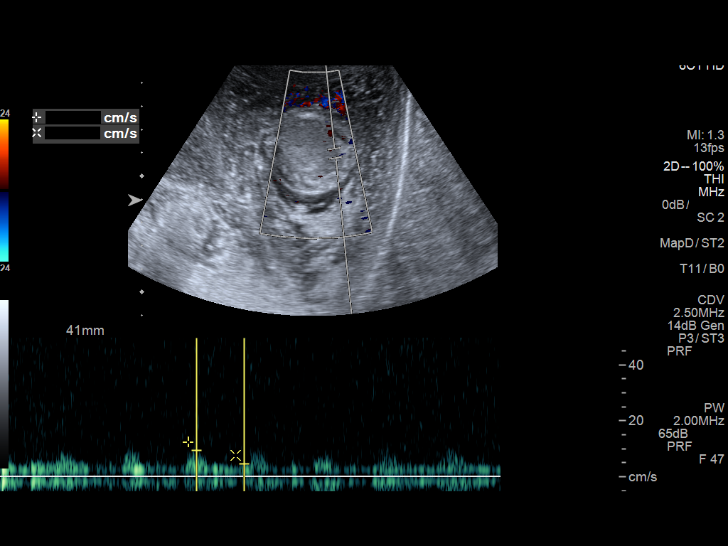
[im 25/38]
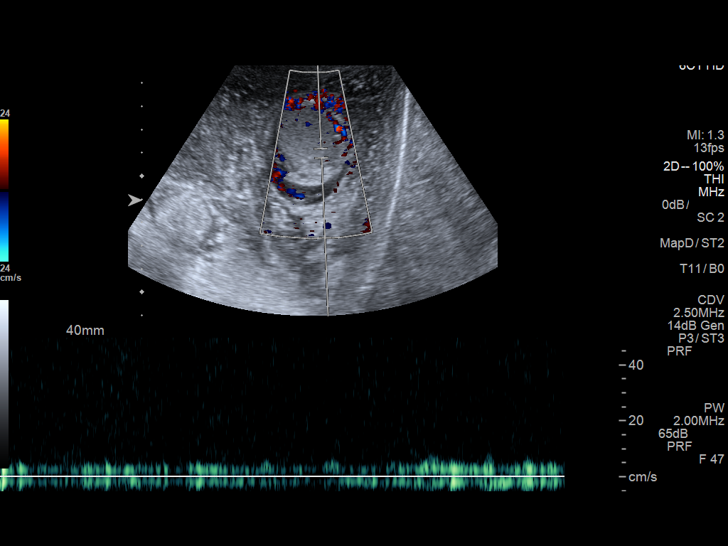
[im 28/38]
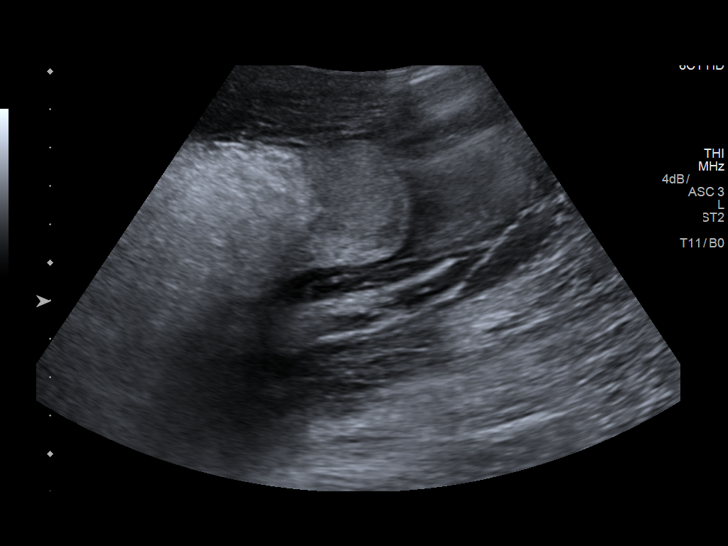
[im 31/38]
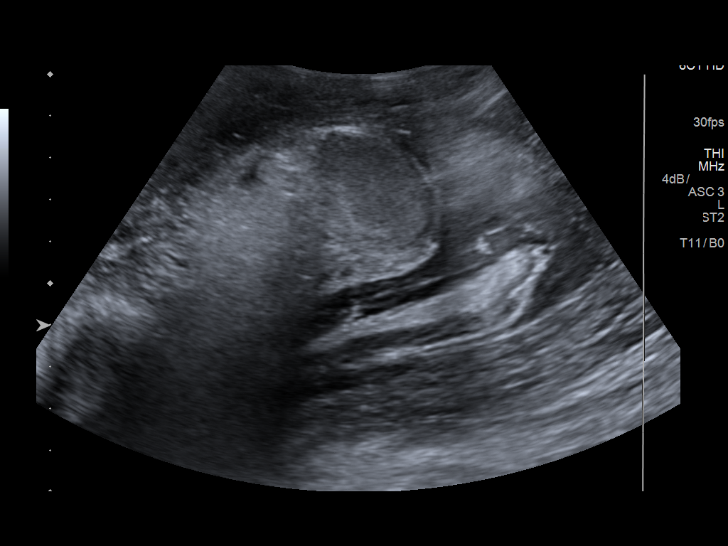
[im 34/38]
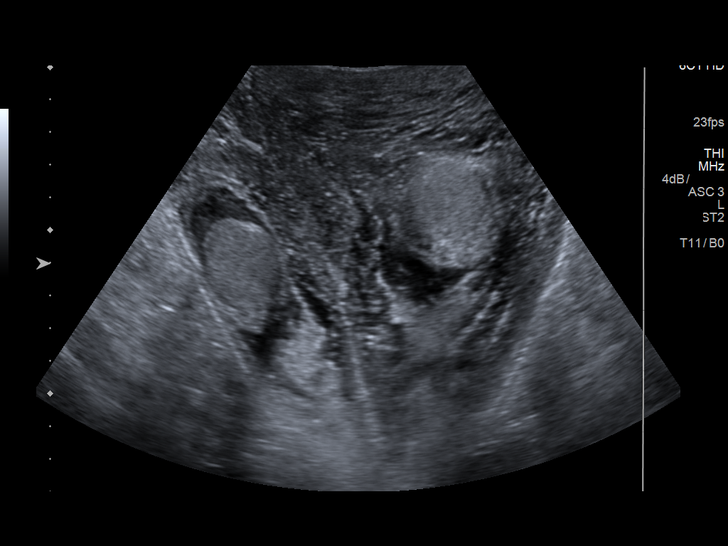
[im 38/38]
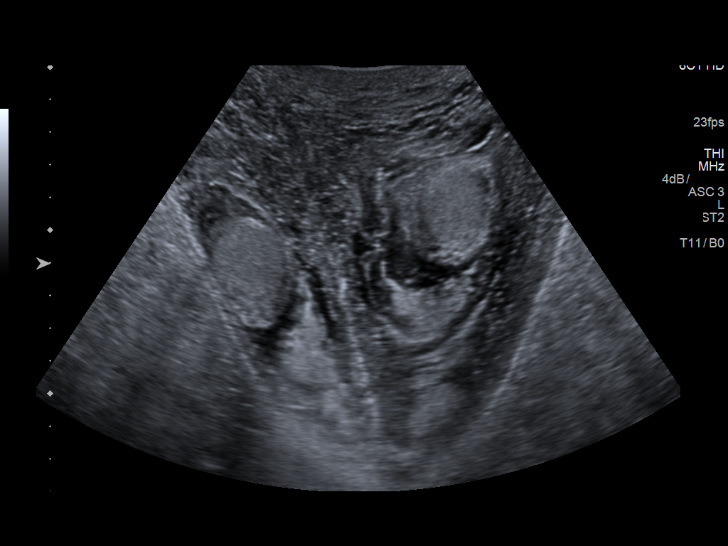

[14 of 25 positions shown; findings below may reference images not displayed]

FINDINGS: Right testicle

Measurements: 4.4 x 2.6 x 2.3 cm. No mass or microlithiasis
visualized.

Left testicle

Measurements: 3.5 x 2.0 x 3.1 cm. No mass or microlithiasis
visualized.

Right epididymis:  Not visible

Left epididymis:  Not visible

Hydrocele:  None

Varicocele:  None visible.

Pulsed Doppler interrogation of both testes demonstrates normal low
resistance arterial and venous waveforms bilaterally.

There is scrotal skin thickening/edema bilaterally.
IMPRESSION: Normal testes. No testicular mass or torsion. No abnormal scrotal
fluid collections. Extensive scrotal skin thickening/edema.

## 2017-12-25 IMAGING — CR DG CHEST 2V
2 series · 2 of 2 positions shown · non-contrast
Comparison: None.

CLINICAL DATA: Generalized weakness.

EXAM:
CHEST  2 VIEW

[chest lat]
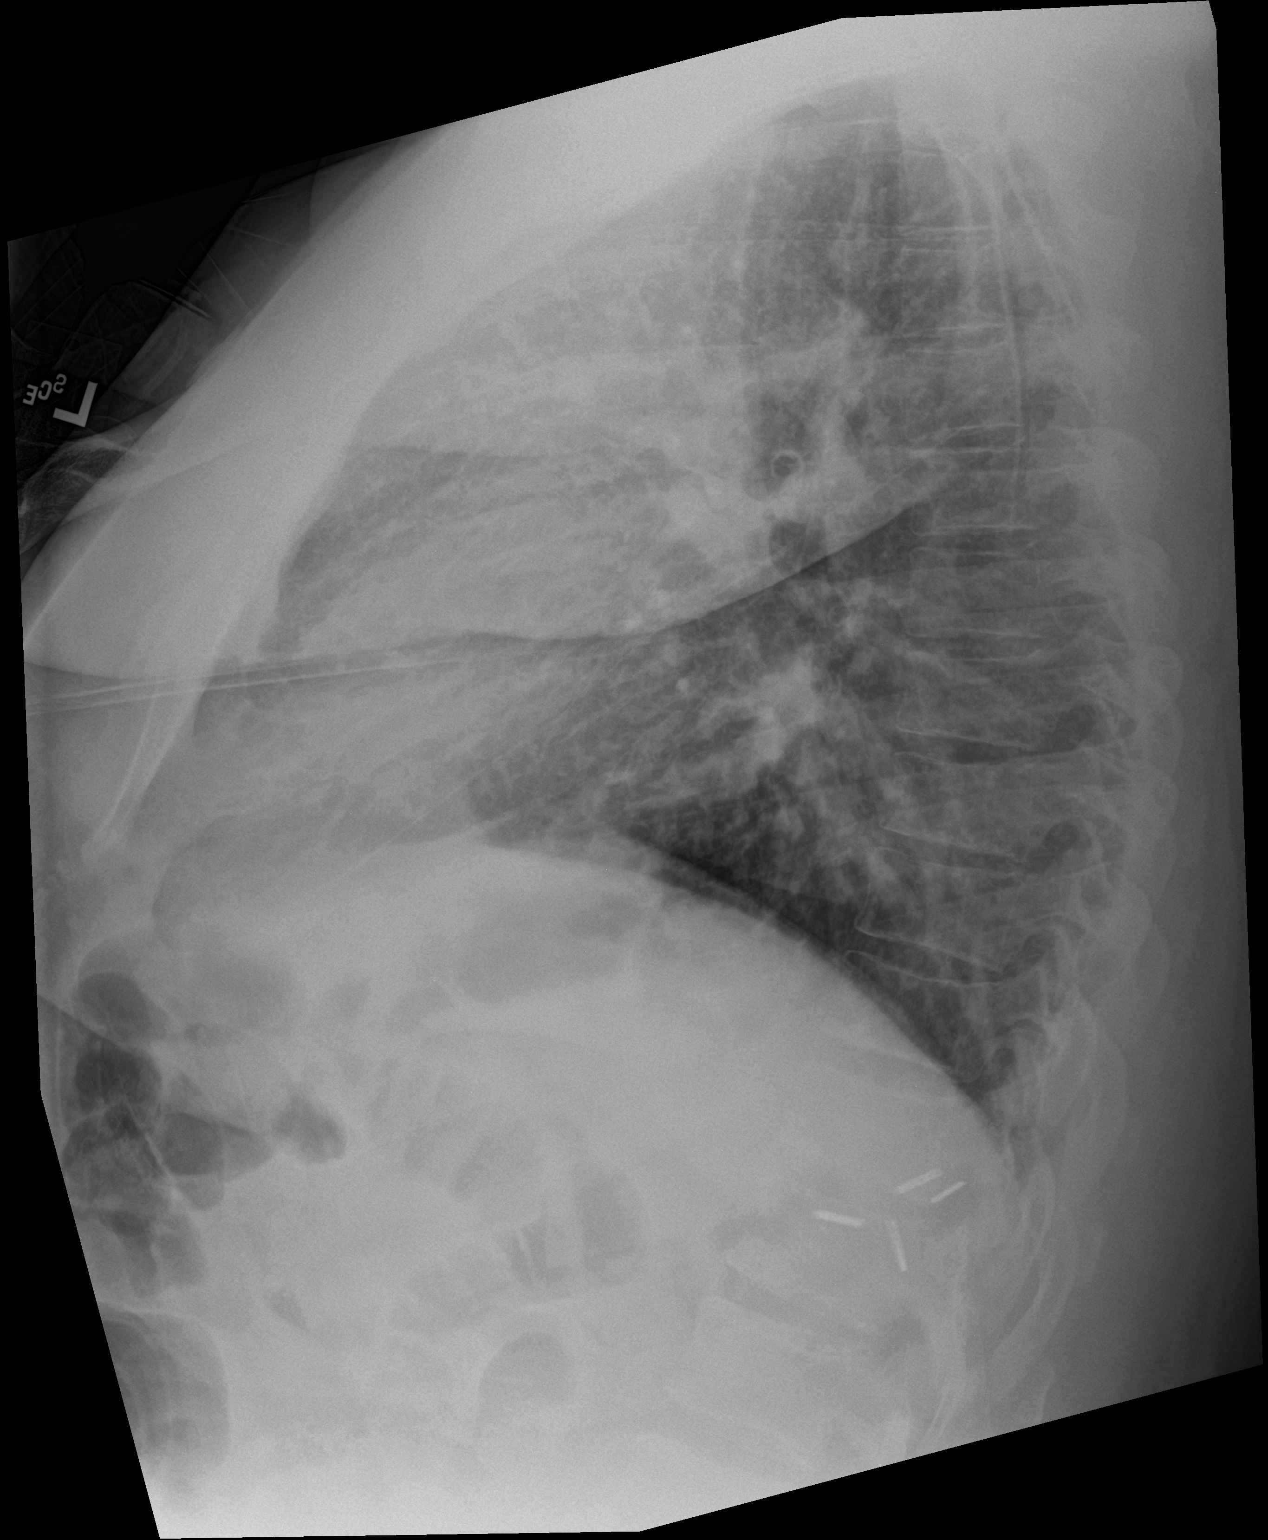

[chest ap]
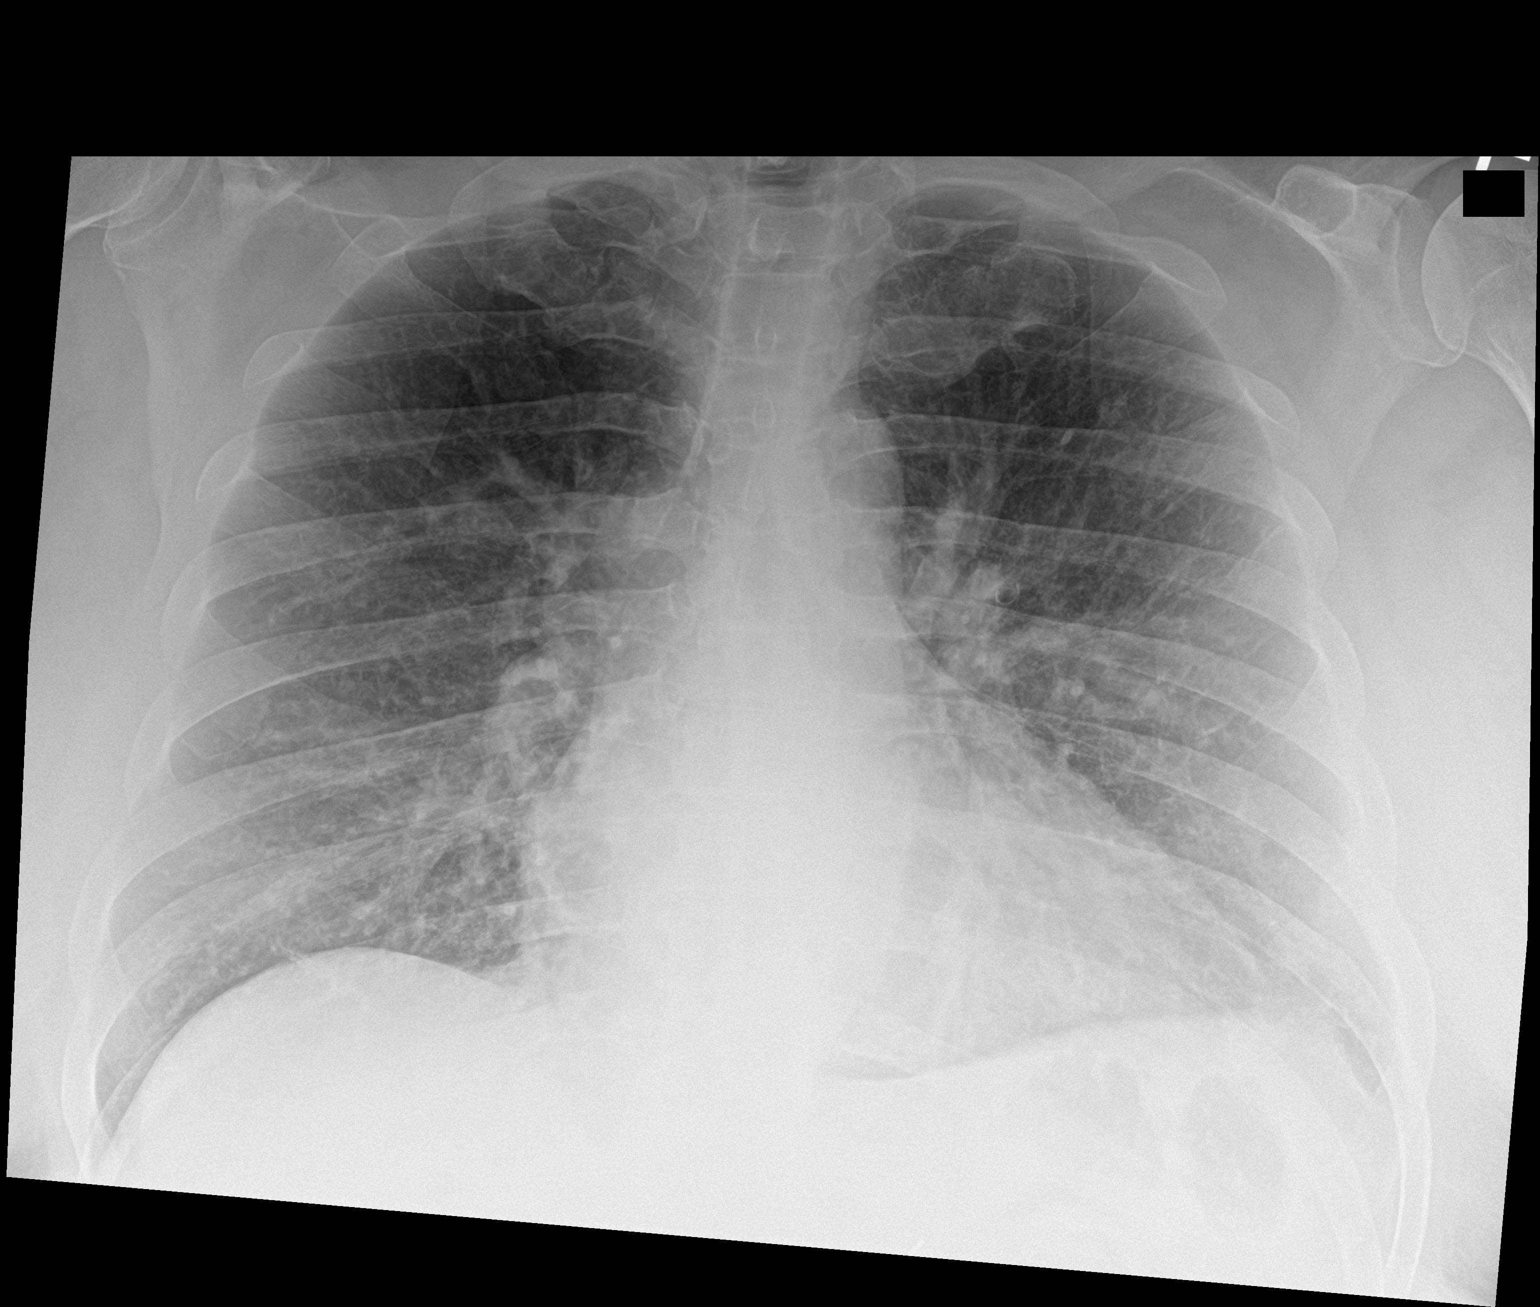

[2 of 2 positions shown; findings below may reference images not displayed]

FINDINGS: Patchy right parahilar airspace disease is suspicious for pneumonia.
Cardiopericardial silhouette is at upper limits of normal for size.
There is pulmonary vascular congestion without overt pulmonary
edema. The visualized bony structures of the thorax are intact.
IMPRESSION: Patchy right parahilar airspace disease suspicious for pneumonia.

Borderline cardiomegaly with vascular congestion.

## 2018-01-16 DEATH — deceased

## 2018-04-10 LAB — HIV ANTIBODY (ROUTINE TESTING W REFLEX): HIV Screen 4th Generation wRfx: NONREACTIVE
